# Patient Record
Sex: Female | Born: 1995
Health system: Southern US, Community
[De-identification: ages and names within clinical notes are randomized; demographics above are authoritative.]

## PROBLEM LIST (undated history)

## (undated) DIAGNOSIS — G43909 Migraine, unspecified, not intractable, without status migrainosus: Secondary | ICD-10-CM

## (undated) DIAGNOSIS — K219 Gastro-esophageal reflux disease without esophagitis: Secondary | ICD-10-CM

## (undated) DIAGNOSIS — E282 Polycystic ovarian syndrome: Secondary | ICD-10-CM

## (undated) DIAGNOSIS — J45909 Unspecified asthma, uncomplicated: Secondary | ICD-10-CM

## (undated) HISTORY — DX: Gastro-esophageal reflux disease without esophagitis: K21.9

## (undated) HISTORY — DX: Migraine, unspecified, not intractable, without status migrainosus: G43.909

## (undated) HISTORY — DX: Polycystic ovarian syndrome: E28.2

## (undated) HISTORY — PX: NO PAST SURGERIES: SHX2092

---
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
## 2015-10-05 ENCOUNTER — Other Ambulatory Visit: Payer: Self-pay | Admitting: Physician Assistant

## 2015-10-05 DIAGNOSIS — R102 Pelvic and perineal pain: Secondary | ICD-10-CM

## 2015-10-05 DIAGNOSIS — R1013 Epigastric pain: Secondary | ICD-10-CM

## 2015-10-09 ENCOUNTER — Ambulatory Visit
Admission: RE | Admit: 2015-10-09 | Discharge: 2015-10-09 | Disposition: A | Discharge status: Home / Self Care | Payer: 59 | Source: Ambulatory Visit | Attending: Physician Assistant | Admitting: Physician Assistant

## 2015-10-09 DIAGNOSIS — R102 Pelvic and perineal pain: Secondary | ICD-10-CM

## 2015-10-09 DIAGNOSIS — R1013 Epigastric pain: Secondary | ICD-10-CM

## 2016-01-18 ENCOUNTER — Emergency Department (HOSPITAL_COMMUNITY): Payer: 59

## 2016-01-18 ENCOUNTER — Emergency Department (HOSPITAL_COMMUNITY)
Admission: EM | Admit: 2016-01-18 | Discharge: 2016-01-18 | Disposition: A | Discharge status: Home / Self Care | Payer: 59 | Attending: Emergency Medicine | Admitting: Emergency Medicine

## 2016-01-18 ENCOUNTER — Encounter (HOSPITAL_COMMUNITY): Payer: Self-pay | Admitting: *Deleted

## 2016-01-18 DIAGNOSIS — R000 Tachycardia, unspecified: Secondary | ICD-10-CM | POA: Diagnosis not present

## 2016-01-18 DIAGNOSIS — Z79899 Other long term (current) drug therapy: Secondary | ICD-10-CM | POA: Diagnosis not present

## 2016-01-18 DIAGNOSIS — J45901 Unspecified asthma with (acute) exacerbation: Secondary | ICD-10-CM | POA: Diagnosis not present

## 2016-01-18 DIAGNOSIS — Z793 Long term (current) use of hormonal contraceptives: Secondary | ICD-10-CM | POA: Insufficient documentation

## 2016-01-18 DIAGNOSIS — J45909 Unspecified asthma, uncomplicated: Secondary | ICD-10-CM | POA: Diagnosis present

## 2016-01-18 DIAGNOSIS — Z7951 Long term (current) use of inhaled steroids: Secondary | ICD-10-CM | POA: Insufficient documentation

## 2016-01-18 HISTORY — DX: Unspecified asthma, uncomplicated: J45.909

## 2016-01-18 LAB — I-STAT VENOUS BLOOD GAS, ED
ACID-BASE DEFICIT: 3 mmol/L — AB (ref 0.0–2.0)
BICARBONATE: 19.3 meq/L — AB (ref 20.0–24.0)
O2 SAT: 72 %
PH VEN: 7.471 — AB (ref 7.250–7.300)
TCO2: 20 mmol/L (ref 0–100)
pCO2, Ven: 26.5 mmHg — ABNORMAL LOW (ref 45.0–50.0)
pO2, Ven: 34 mmHg (ref 30.0–45.0)

## 2016-01-18 LAB — CBC WITH DIFFERENTIAL/PLATELET
Basophils Absolute: 0 10*3/uL (ref 0.0–0.1)
Basophils Relative: 0 %
EOS ABS: 0.1 10*3/uL (ref 0.0–0.7)
EOS PCT: 1 %
HCT: 38.3 % (ref 36.0–46.0)
HEMOGLOBIN: 13.1 g/dL (ref 12.0–15.0)
LYMPHS ABS: 3.6 10*3/uL (ref 0.7–4.0)
Lymphocytes Relative: 38 %
MCH: 29.2 pg (ref 26.0–34.0)
MCHC: 34.2 g/dL (ref 30.0–36.0)
MCV: 85.5 fL (ref 78.0–100.0)
MONOS PCT: 5 %
Monocytes Absolute: 0.4 10*3/uL (ref 0.1–1.0)
Neutro Abs: 5.3 10*3/uL (ref 1.7–7.7)
Neutrophils Relative %: 56 %
PLATELETS: 263 10*3/uL (ref 150–400)
RBC: 4.48 MIL/uL (ref 3.87–5.11)
RDW: 12.8 % (ref 11.5–15.5)
WBC: 9.3 10*3/uL (ref 4.0–10.5)

## 2016-01-18 LAB — D-DIMER, QUANTITATIVE: D-Dimer, Quant: <0.27 ug/mL-FEU (ref 0.00–0.50)

## 2016-01-18 LAB — BASIC METABOLIC PANEL
Anion gap: 12 (ref 5–15)
BUN: 11 mg/dL (ref 6–20)
CHLORIDE: 109 mmol/L (ref 101–111)
CO2: 20 mmol/L — ABNORMAL LOW (ref 22–32)
CREATININE: 0.85 mg/dL (ref 0.44–1.00)
Calcium: 9.9 mg/dL (ref 8.9–10.3)
GFR calc Af Amer: >60 mL/min (ref >60)
GFR calc non Af Amer: >60 mL/min (ref >60)
Glucose, Bld: 141 mg/dL — ABNORMAL HIGH (ref 65–99)
Potassium: 3.4 mmol/L — ABNORMAL LOW (ref 3.5–5.1)
SODIUM: 141 mmol/L (ref 135–145)

## 2016-01-18 MED ORDER — PREDNISONE 20 MG PO TABS: 60.0000 mg | ORAL_TABLET | Freq: Every day | ORAL | Status: DC

## 2016-01-18 MED ORDER — MAGNESIUM SULFATE 2 GM/50ML IV SOLN
2.0000 g | Freq: Once | INTRAVENOUS | Status: AC
  Administered 2016-01-18: 2 g via INTRAVENOUS
  Filled 2016-01-18: qty 50

## 2016-01-18 MED ORDER — IPRATROPIUM BROMIDE 0.02 % IN SOLN
0.5000 mg | Freq: Once | RESPIRATORY_TRACT | Status: AC
  Administered 2016-01-18: 0.5 mg via RESPIRATORY_TRACT

## 2016-01-18 MED ORDER — METHYLPREDNISOLONE SODIUM SUCC 125 MG IJ SOLR
125.0000 mg | Freq: Once | INTRAMUSCULAR | Status: AC
  Administered 2016-01-18: 125 mg via INTRAVENOUS
  Filled 2016-01-18: qty 2

## 2016-01-18 MED ORDER — SODIUM CHLORIDE 0.9 % IV BOLUS (SEPSIS)
1000.0000 mL | Freq: Once | INTRAVENOUS | Status: AC
  Administered 2016-01-18: 1000 mL via INTRAVENOUS

## 2016-01-18 MED ORDER — ALBUTEROL (5 MG/ML) CONTINUOUS INHALATION SOLN
2.5000 mg/h | INHALATION_SOLUTION | RESPIRATORY_TRACT | Status: DC
  Administered 2016-01-18: 2.5 mg/h via RESPIRATORY_TRACT
  Filled 2016-01-18 (×2): qty 20

## 2016-01-18 MED ORDER — IPRATROPIUM BROMIDE 0.02 % IN SOLN
1.0000 mg | Freq: Once | RESPIRATORY_TRACT | Status: AC
  Administered 2016-01-18: 1 mg via RESPIRATORY_TRACT
  Filled 2016-01-18: qty 5

## 2016-01-18 MED ORDER — ALBUTEROL SULFATE (2.5 MG/3ML) 0.083% IN NEBU
5.0000 mg | INHALATION_SOLUTION | Freq: Once | RESPIRATORY_TRACT | Status: AC
  Administered 2016-01-18: 5 mg via RESPIRATORY_TRACT

## 2016-01-18 NOTE — Discharge Instructions (Signed)
Asthma Attack Prevention Ms. Pickard, continue to use your albuterol inhaler every 6 hours for the next 2 days. Then only use it as needed. Take prednisone for 4 more days to present your asthma from coming back. See a primary care physician within 3 days for close follow-up. If any symptoms worsen come back to emergency department immediately. Thank you. While you may not be able to control the fact that you have asthma, you can take actions to prevent asthma attacks. The best way to prevent asthma attacks is to maintain good control of your asthma. You can achieve this by:  Taking your medicines as directed.  Avoiding things that can irritate your airways or make your asthma symptoms worse (asthma triggers).  Keeping track of how well your asthma is controlled and of any changes in your symptoms.  Responding quickly to worsening asthma symptoms (asthma attack).  Seeking emergency care when it is needed. WHAT ARE SOME WAYS TO PREVENT AN ASTHMA ATTACK? Have a Plan Work with your health care provider to create a written plan for managing and treating your asthma attacks (asthma action plan). This plan includes:  A list of your asthma triggers and how you can avoid them.  Information on when medicines should be taken and when their dosages should be changed.  The use of a device that measures how well your lungs are working (peak flow meter). Monitor Your Asthma Use your peak flow meter and record your results in a journal every day. A drop in your peak flow numbers on one or more days may indicate the start of an asthma attack. This can happen even before you start to feel symptoms. You can prevent an asthma attack from getting worse by following the steps in your asthma action plan. Avoid Asthma Triggers Work with your asthma health care provider to find out what your asthma triggers are. This can be done by:  Allergy testing.  Keeping a journal that notes when asthma attacks occur and  the factors that may have contributed to them.  Determining if there are other medical conditions that are making your asthma worse. Once you have determined your asthma triggers, take steps to avoid them. This may include avoiding excessive or prolonged exposure to:  Dust. Have someone dust and vacuum your home for you once or twice a week. Using a high-efficiency particulate arrestance (HEPA) vacuum is best.  Smoke. This includes campfire smoke, forest fire smoke, and secondhand smoke from tobacco products.  Pet dander. Avoid contact with animals that you know you are allergic to.  Allergens from trees, grasses or pollens. Avoid spending a lot of time outdoors when pollen counts are high, and on very windy days.  Very cold, dry, or humid air.  Mold.  Foods that contain high amounts of sulfites.  Strong odors.  Outdoor air pollutants, such as Museum/gallery exhibitions officer.  Indoor air pollutants, such as aerosol sprays and fumes from household cleaners.  Household pests, including dust mites and cockroaches, and pest droppings.  Certain medicines, including NSAIDs. Always talk to your health care provider before stopping or starting any new medicines. Medicines Take over-the-counter and prescription medicines only as told by your health care provider. Many asthma attacks can be prevented by carefully following your medicine schedule. Taking your medicines correctly is especially important when you cannot avoid certain asthma triggers. Act Quickly If an asthma attack does happen, acting quickly can decrease how severe it is and how long it lasts. Take these steps:  Pay attention to your symptoms. If you are coughing, wheezing, or having difficulty breathing, do not wait to see if your symptoms go away on their own. Follow your asthma action plan.  If you have followed your asthma action plan and your symptoms are not improving, call your health care provider or seek immediate medical care at the  nearest hospital. It is important to note how often you need to use your fast-acting rescue inhaler. If you are using your rescue inhaler more often, it may mean that your asthma is not under control. Adjusting your asthma treatment plan may help you to prevent future asthma attacks and help you to gain better control of your condition. HOW CAN I PREVENT AN ASTHMA ATTACK WHEN I EXERCISE? Follow advice from your health care provider about whether you should use your fast-acting inhaler before exercising. Many people with asthma experience exercise-induced bronchoconstriction (EIB). This condition often worsens during vigorous exercise in cold, humid, or dry environments. Usually, people with EIB can stay very active by pre-treating with a fast-acting inhaler before exercising.   This information is not intended to replace advice given to you by your health care provider. Make sure you discuss any questions you have with your health care provider.   Document Released: 11/19/2009 Document Revised: 08/22/2015 Document Reviewed: 05/03/2015 Elsevier Interactive Patient Education [employer]

## 2016-01-18 NOTE — ED Provider Notes (Signed)
CSN: [id number]     Arrival date & time 01/18/16  0107 History  By signing my name below, I, Tammy French, attest that this documentation has been prepared under the direction and in the presence of [name], MD. Electronically Signed: [name], ED Scribe. 01/18/2016. 1:55 AM.    Chief Complaint  Patient presents with  . Asthma   The history is provided by the patient and a parent. No language interpreter was used.   HPI Comments: Tammy French is a 20 y.o. female, with a h/o asthma, who presents to the Emergency Department complaining of sudden onset, constant centralized chest pain onset 3 hours ago with progression of sudden onset, worsening SOB following onset of CP while driving home from work. The pt has a h/o asthma but reports her current chest pain is abnormal of her asthma exacerbations. She notes a mild cough today but no other associated symptoms. The pt attempted a breathing treatment prior to arrival but was unable to finish the treatment due to worsening of SOB. She has also used her inhaler 4 times since onset of SOB without relief. The pt received her first Depo injection 4 days ago.  The pt denies any recent illness. Mom denies the pt to have been on recent long trips, to have received recent surgery, or pain or edema to BLE.    Past Medical History  Diagnosis Date  . Asthma    History reviewed. No pertinent past surgical history. Family History  Problem Relation Age of Onset  . Diabetes Maternal Grandmother    Social History  Substance Use Topics  . Smoking status: Never Smoker   . Smokeless tobacco: Never Used  . Alcohol Use: 0.5 oz/week    1 Standard drinks or equivalent per week   OB History    No data available     Review of Systems  Respiratory: Positive for shortness of breath.   Cardiovascular: Positive for chest pain. Negative for leg swelling.  A complete 10 system review of systems was obtained and is otherwise negative except at noted  in the HPI and PMH.  Allergies  Review of patient's allergies indicates no known allergies.  Home Medications   Prior to Admission medications   Medication Sig Start Date End Date Taking? Authorizing Provider  beclomethasone (QVAR) 40 MCG/ACT inhaler Inhale 2 puffs into the lungs 2 (two) times daily as needed (SHORTNESS  OF BREATH).   Yes Historical Provider, MD  medroxyPROGESTERone (DEPO-PROVERA) 150 MG/ML injection Inject 150 mg into the muscle every 3 (three) months.   Yes Historical Provider, MD  polyethylene glycol (MIRALAX / GLYCOLAX) packet Take 17 g by mouth daily as needed for moderate constipation.   Yes Historical Provider, MD   BP 134/67 mmHg  Pulse 103  Resp 34  Ht  (1.676 m)  Wt 148 lb (67.132 kg)  BMI 23.90 kg/m2  SpO2 100%  LMP 12/18/2015 Physical Exam  Constitutional: She is oriented to person, place, and time. She appears well-developed and well-nourished. No distress.  HENT:  Head: Normocephalic and atraumatic.  Nose: Nose normal.  Mouth/Throat: Oropharynx is clear and moist. No oropharyngeal exudate.  Eyes: Conjunctivae and EOM are normal. Pupils are equal, round, and reactive to light. No scleral icterus.  Neck: Normal range of motion. Neck supple. No JVD present. No tracheal deviation present. No thyromegaly present.  Cardiovascular: Regular rhythm and normal heart sounds.  Exam reveals no gallop and no friction rub.   No murmur heard. Tachycardic.  Pulmonary/Chest: Breath sounds normal. She is in respiratory distress. She has no wheezes. She exhibits no tenderness.  Tachypnea, increased use of accessory muscles, increased work of breathing, respiratory distress, lungs clear.  Abdominal: Soft. Bowel sounds are normal. She exhibits no distension and no mass. There is no tenderness. There is no rebound and no guarding.  Musculoskeletal: Normal range of motion. She exhibits no edema or tenderness.  Lymphadenopathy:    She has no cervical adenopathy.   Neurological: She is alert and oriented to person, place, and time. No cranial nerve deficit. She exhibits normal muscle tone.  Skin: Skin is warm and dry. No rash noted. No erythema. No pallor.  Nursing note and vitals reviewed.   ED Course  Procedures  DIAGNOSTIC STUDIES: Oxygen Saturation is 100% on RA, normal by my interpretation.    COORDINATION OF CARE: 1:27 AM Discussed treatment plan with pt. Pt acknowledges and agrees to plan.   Labs Review Labs Reviewed  BASIC METABOLIC PANEL - Abnormal; Notable for the following:    Potassium 3.4 (*)    CO2 20 (*)    Glucose, Bld 141 (*)    All other components within normal limits  I-STAT VENOUS BLOOD GAS, ED - Abnormal; Notable for the following:    pH, Ven 7.471 (*)    pCO2, Ven 26.5 (*)    Bicarbonate 19.3 (*)    Acid-base deficit 3.0 (*)    All other components within normal limits  CBC WITH DIFFERENTIAL/PLATELET  D-DIMER, QUANTITATIVE (NOT AT [facility])    Imaging Review Dg Chest 2 View  01/18/2016  CLINICAL DATA:  Left-sided chest pain for 1 day EXAM: CHEST  2 VIEW COMPARISON:  None. FINDINGS: Normal heart size and mediastinal contours. No acute infiltrate or edema. No effusion or pneumothorax. No acute osseous findings. IMPRESSION: Negative chest. Electronically Signed   By: [name] M.D.   On: 01/18/2016 03:28   I have personally reviewed and evaluated these images and lab results as part of my medical decision-making.   EKG Interpretation   Date/Time:  Friday January 18 2016 01:37:48 EST Ventricular Rate:  103 PR Interval:  151 QRS Duration: 82 QT Interval:  331 QTC Calculation: 433 R Axis:   51 Text Interpretation:  Sinus tachycardia No old tracing to compare  Confirmed by Erroll Luna [phone]) on 01/18/2016 2:04:22 AM      MDM   Final diagnoses:  None   Patient presents emergency department for shortness of breath. She states is consistent with her asthma however she is having chest tightness as  well that is worse with inspiration. She states this is not normal for her. She has no other risk factors for pulmonary embolism. Will obtain d-dimer. She was given albuterol, ipratropium, sodium Medrol, magnesium for treatment. Patient also given 1 L of IV fluids.  Her initial presentation revealed no wheezing, this is unusual for asthma. Mom also states is unusual for her not to hear audible wheezing at home.  Dimer is negative. Chest x-ray does not show pneumonia. Symptomatically patient states she feels much better. Her tachypnea and increased work of breathing has resolved. We'll discharge home with 4 more days of prednisone. Primary care follow-up is advised. Patient is tachycardic likely due to albuterol treatments. She appears well-developed and acute distress, vital signs remain within her normal limits and she is French for discharge.   I personally performed the services described in this documentation, which was scribed in my presence. The recorded information has been  reviewed and is accurate.      [name], MD 01/18/16 [id number]

## 2016-01-18 NOTE — ED Notes (Signed)
The ptis c/o having shortness of breath today. Her inhaler and her hhn machine has not been helping  She arrived with much resp difficulty. Unable to speak at triage lmpjan

## 2016-04-25 ENCOUNTER — Emergency Department (HOSPITAL_COMMUNITY): Payer: 59

## 2016-04-25 ENCOUNTER — Encounter (HOSPITAL_COMMUNITY): Payer: Self-pay

## 2016-04-25 ENCOUNTER — Emergency Department (HOSPITAL_COMMUNITY)
Admission: EM | Admit: 2016-04-25 | Discharge: 2016-04-25 | Disposition: A | Discharge status: Home / Self Care | Payer: 59 | Attending: Emergency Medicine | Admitting: Emergency Medicine

## 2016-04-25 DIAGNOSIS — S3992XA Unspecified injury of lower back, initial encounter: Secondary | ICD-10-CM | POA: Insufficient documentation

## 2016-04-25 DIAGNOSIS — Y92410 Unspecified street and highway as the place of occurrence of the external cause: Secondary | ICD-10-CM | POA: Insufficient documentation

## 2016-04-25 DIAGNOSIS — S29002A Unspecified injury of muscle and tendon of back wall of thorax, initial encounter: Secondary | ICD-10-CM | POA: Diagnosis not present

## 2016-04-25 DIAGNOSIS — Z3202 Encounter for pregnancy test, result negative: Secondary | ICD-10-CM | POA: Diagnosis not present

## 2016-04-25 DIAGNOSIS — Y998 Other external cause status: Secondary | ICD-10-CM | POA: Diagnosis not present

## 2016-04-25 DIAGNOSIS — Y9389 Activity, other specified: Secondary | ICD-10-CM | POA: Diagnosis not present

## 2016-04-25 DIAGNOSIS — M549 Dorsalgia, unspecified: Secondary | ICD-10-CM

## 2016-04-25 DIAGNOSIS — S4992XA Unspecified injury of left shoulder and upper arm, initial encounter: Secondary | ICD-10-CM | POA: Insufficient documentation

## 2016-04-25 DIAGNOSIS — J45909 Unspecified asthma, uncomplicated: Secondary | ICD-10-CM | POA: Diagnosis not present

## 2016-04-25 DIAGNOSIS — Z7952 Long term (current) use of systemic steroids: Secondary | ICD-10-CM | POA: Insufficient documentation

## 2016-04-25 DIAGNOSIS — M545 Low back pain, unspecified: Secondary | ICD-10-CM

## 2016-04-25 LAB — POC URINE PREG, ED: Preg Test, Ur: NEGATIVE

## 2016-04-25 MED ORDER — NAPROXEN 500 MG PO TABS
500.0000 mg | ORAL_TABLET | Freq: Two times a day (BID) | ORAL | Status: DC
Start: 2016-04-25 — End: 2017-08-21

## 2016-04-25 MED ORDER — METHOCARBAMOL 500 MG PO TABS: 500.0000 mg | ORAL_TABLET | Freq: Two times a day (BID) | ORAL | Status: DC
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
## 2016-09-14 ENCOUNTER — Emergency Department (HOSPITAL_COMMUNITY)
Admission: EM | Admit: 2016-09-14 | Discharge: 2016-09-14 | Disposition: A | Discharge status: Home / Self Care | Payer: 59 | Attending: Emergency Medicine | Admitting: Emergency Medicine

## 2016-09-14 ENCOUNTER — Encounter (HOSPITAL_COMMUNITY): Payer: Self-pay | Admitting: Oncology

## 2016-09-14 DIAGNOSIS — J45909 Unspecified asthma, uncomplicated: Secondary | ICD-10-CM | POA: Insufficient documentation

## 2016-09-14 DIAGNOSIS — Z79899 Other long term (current) drug therapy: Secondary | ICD-10-CM | POA: Insufficient documentation

## 2016-09-14 DIAGNOSIS — Z791 Long term (current) use of non-steroidal anti-inflammatories (NSAID): Secondary | ICD-10-CM | POA: Diagnosis not present

## 2016-09-14 DIAGNOSIS — M545 Low back pain: Secondary | ICD-10-CM | POA: Diagnosis present

## 2016-09-14 DIAGNOSIS — L0501 Pilonidal cyst with abscess: Secondary | ICD-10-CM | POA: Diagnosis not present

## 2016-09-14 MED ORDER — TRAMADOL HCL 50 MG PO TABS: 50.0000 mg | ORAL_TABLET | Freq: Four times a day (QID) | ORAL | 0 refills | Status: DC | PRN

## 2016-09-14 MED ORDER — DOCUSATE SODIUM 100 MG PO CAPS: 100.0000 mg | ORAL_CAPSULE | Freq: Two times a day (BID) | ORAL | 0 refills | Status: DC

## 2016-09-14 MED ORDER — OXYCODONE-ACETAMINOPHEN 5-325 MG PO TABS
1.0000 | ORAL_TABLET | Freq: Once | ORAL | Status: AC
  Administered 2016-09-14: 1 via ORAL
  Filled 2016-09-14: qty 1

## 2016-09-14 MED ORDER — SULFAMETHOXAZOLE-TRIMETHOPRIM 800-160 MG PO TABS: 1.0000 | ORAL_TABLET | Freq: Two times a day (BID) | ORAL | 0 refills | Status: AC

## 2016-09-14 NOTE — ED Triage Notes (Signed)
Pt c/o pain in gluteal cleft x5 days.  Area is not edematous or red.  Denies fever.  Rates pain 9/10.

## 2016-09-14 NOTE — ED Provider Notes (Signed)
[facility] Provider Note   CSN: [id number] Arrival date & time: 09/14/16  0022  By signing my name below, I, Christy Sartorius, attest that this documentation has been prepared under the direction and in the presence of Gilda Crease, MD . Electronically Signed: Christy Sartorius, Scribe. 09/14/2016. 1:08 AM.  History   Chief Complaint Chief Complaint  Patient presents with  . Abscess   The history is provided by the patient and medical records. No language interpreter was used.     HPI Comments:  Tammy French is a 20 y.o. female who presents to the Emergency Department complaining of pain in her medial buttocks along her coccyx.  Her pain is worse when sitting or pressing on the site.  She states she's never had this problem before.  No alleviating factors noted.  Pt denies known bumps or swelling at the site and recent injury to her coccyx.    Past Medical History:  Diagnosis Date  . Asthma     There are no active problems to display for this patient.   History reviewed. No pertinent surgical history.  OB History    No data available       Home Medications    Prior to Admission medications   Medication Sig Start Date End Date Taking? Authorizing Provider  beclomethasone (QVAR) 40 MCG/ACT inhaler Inhale 2 puffs into the lungs 2 (two) times daily as needed (SHORTNESS  OF BREATH).    Historical Provider, MD  docusate sodium (COLACE) 100 MG capsule Take 1 capsule (100 mg total) by mouth every 12 (twelve) hours. 09/14/16   Gilda Crease, MD  medroxyPROGESTERone (DEPO-PROVERA) 150 MG/ML injection Inject 150 mg into the muscle every 3 (three) months.    Historical Provider, MD  methocarbamol (ROBAXIN) 500 MG tablet Take 1 tablet (500 mg total) by mouth 2 (two) times daily. 04/25/16   Heather Laisure, PA-C  naproxen (NAPROSYN) 500 MG tablet Take 1 tablet (500 mg total) by mouth 2 (two) times daily. 04/25/16   Heather Laisure, PA-C  polyethylene glycol  (MIRALAX / GLYCOLAX) packet Take 17 g by mouth daily as needed for moderate constipation.    Historical Provider, MD  predniSONE (DELTASONE) 20 MG tablet Take 3 tablets (60 mg total) by mouth daily. 01/18/16   [name], MD  sulfamethoxazole-trimethoprim (BACTRIM DS,SEPTRA DS) 800-160 MG tablet Take 1 tablet by mouth 2 (two) times daily. 09/14/16 09/24/16  Gilda Crease, MD  traMADol (ULTRAM) 50 MG tablet Take 1 tablet (50 mg total) by mouth every 6 (six) hours as needed. 09/14/16   Gilda Crease, MD    Family History Family History  Problem Relation Age of Onset  . Diabetes Maternal Grandmother     Social History Social History  Substance Use Topics  . Smoking status: Never Smoker  . Smokeless tobacco: Never Used  . Alcohol use 0.5 oz/week    1 Standard drinks or equivalent per week     Allergies   Review of patient's allergies indicates no known allergies.   Review of Systems Review of Systems  Constitutional: Negative for fever.  Musculoskeletal: Positive for myalgias.  Skin:       Pain in superior gluteal cleft.    All other systems reviewed and are negative.  Physical Exam Updated Vital Signs BP 120/75 (BP Location: Left Arm)   Pulse 90   Temp 98.4 F (36.9 C) (Oral)   Resp 13   Ht 5\' 6"  (1.676 m)   Wt 160 lb (  72.6 kg)   SpO2 100%   BMI 25.82 kg/m   Physical Exam  Constitutional: She is oriented to person, place, and time. She appears well-developed and well-nourished. No distress.  HENT:  Head: Normocephalic and atraumatic.  Right Ear: Hearing normal.  Left Ear: Hearing normal.  Nose: Nose normal.  Mouth/Throat: Oropharynx is clear and moist and mucous membranes are normal.  Eyes: Conjunctivae and EOM are normal. Pupils are equal, round, and reactive to light.  Neck: Normal range of motion. Neck supple.  Cardiovascular: Regular rhythm, S1 normal and S2 normal.  Exam reveals no gallop and no friction rub.   No murmur  heard. Pulmonary/Chest: Effort normal and breath sounds normal. No respiratory distress. She exhibits no tenderness.  Abdominal: Soft. Normal appearance and bowel sounds are normal. There is no hepatosplenomegaly. There is no tenderness. There is no rebound, no guarding, no tenderness at McBurney's point and negative Murphy's sign. No hernia.  Musculoskeletal: Normal range of motion.  Neurological: She is alert and oriented to person, place, and time. She has normal strength. No cranial nerve deficit or sensory deficit. Coordination normal. GCS eye subscore is 4. GCS verbal subscore is 5. GCS motor subscore is 6.  Skin: Skin is warm, dry and intact. No rash noted. No cyanosis.  Tenderness to the superior protion of the gluteal cleft.  Without erythema, warmth, induration or fluctuance.    Psychiatric: She has a normal mood and affect. Her speech is normal and behavior is normal. Thought content normal.  Nursing note and vitals reviewed.    ED Treatments / Results   DIAGNOSTIC STUDIES:  Oxygen Saturation is 100% on RA, NML by my interpretation.    COORDINATION OF CARE:  1:08 AM Discussed treatment plan with pt at bedside and pt agreed to plan.  Labs (all labs ordered are listed, but only abnormal results are displayed) Labs Reviewed - No data to display  EKG  EKG Interpretation None       Radiology No results found.  Procedures Procedures (including critical care time)  Medications Ordered in ED Medications  oxyCODONE-acetaminophen (PERCOCET/ROXICET) 5-325 MG per tablet 1 tablet (not administered)     Initial Impression / Assessment and Plan / ED Course  I have reviewed the triage vital signs and the nursing notes.  Pertinent labs & imaging results that were available during my care of the patient were reviewed by me and considered in my medical decision making (see chart for details).  Clinical Course     Patient with skin abscess.  Supportive care and return  precautions discussed.  Pt sent home with Analgesia, antibiotics. The patient appears reasonably screened and/or stabilized for discharge and I doubt any other emergent medical condition requiring further screening, evaluation, or treatment in the ED prior to discharge.  Will refer to general surgery. Patient was counseled to return to the ER if the area continues to worsen for emergency department incision and drainage   Final Clinical Impressions(s) / ED Diagnoses   Final diagnoses:  Pilonidal abscess    New Prescriptions New Prescriptions   DOCUSATE SODIUM (COLACE) 100 MG CAPSULE    Take 1 capsule (100 mg total) by mouth every 12 (twelve) hours.   SULFAMETHOXAZOLE-TRIMETHOPRIM (BACTRIM DS,SEPTRA DS) 800-160 MG TABLET    Take 1 tablet by mouth 2 (two) times daily.   TRAMADOL (ULTRAM) 50 MG TABLET    Take 1 tablet (50 mg total) by mouth every 6 (six) hours as needed.   I personally performed the  services described in this documentation, which was scribed in my presence. The recorded information has been reviewed and is accurate.    Gilda Creasehristopher J Shepherd Finnan, MD 09/14/16 [phone]

## 2016-09-17 ENCOUNTER — Ambulatory Visit: Payer: Self-pay | Admitting: General Surgery

## 2016-09-22 ENCOUNTER — Ambulatory Visit (HOSPITAL_COMMUNITY)
Admission: EM | Admit: 2016-09-22 | Discharge: 2016-09-22 | Disposition: A | Discharge status: Home / Self Care | Payer: 59 | Attending: Internal Medicine | Admitting: Internal Medicine

## 2016-09-22 ENCOUNTER — Encounter (HOSPITAL_COMMUNITY): Payer: Self-pay | Admitting: Emergency Medicine

## 2016-09-22 DIAGNOSIS — Z872 Personal history of diseases of the skin and subcutaneous tissue: Secondary | ICD-10-CM

## 2016-09-22 MED ORDER — LIDOCAINE HCL (PF) 1 % IJ SOLN
INTRAMUSCULAR | Status: AC
  Filled 2016-09-22: qty 30

## 2016-09-22 NOTE — ED Notes (Signed)
Dressing  Applied   To     Buttock

## 2016-09-22 NOTE — ED Provider Notes (Signed)
[facility]    CSN: [id number] Arrival date & time: 09/22/16  1028     History   Chief Complaint Chief Complaint  Patient presents with  . Recurrent Skin Infections    HPI Tammy French is a 20 y.o. female. Seen in ED 10/1 with buttocks abscess and referred to surgery center.  Finished prescribed antibiotic with marked improvement in pain/swelling, presented to surgery center today for scheduled excision but had been rescheduled.  Has off school today.  No fever, no malaise.    HPI  Past Medical History:  Diagnosis Date  . Asthma    History reviewed. No pertinent surgical history.   Home Medications    Prior to Admission medications   Medication Sig Start Date End Date Taking? Authorizing Provider  beclomethasone (QVAR) 40 MCG/ACT inhaler Inhale 2 puffs into the lungs 2 (two) times daily as needed (SHORTNESS  OF BREATH).    Historical Provider, MD  docusate sodium (COLACE) 100 MG capsule Take 1 capsule (100 mg total) by mouth every 12 (twelve) hours. 09/14/16   Gilda Crease, MD  medroxyPROGESTERone (DEPO-PROVERA) 150 MG/ML injection Inject 150 mg into the muscle every 3 (three) months.    Historical Provider, MD  methocarbamol (ROBAXIN) 500 MG tablet Take 1 tablet (500 mg total) by mouth 2 (two) times daily. 04/25/16   Heather Laisure, PA-C  naproxen (NAPROSYN) 500 MG tablet Take 1 tablet (500 mg total) by mouth 2 (two) times daily. 04/25/16   Heather Laisure, PA-C  polyethylene glycol (MIRALAX / GLYCOLAX) packet Take 17 g by mouth daily as needed for moderate constipation.    Historical Provider, MD  predniSONE (DELTASONE) 20 MG tablet Take 3 tablets (60 mg total) by mouth daily. 01/18/16   [name], MD  sulfamethoxazole-trimethoprim (BACTRIM DS,SEPTRA DS) 800-160 MG tablet Take 1 tablet by mouth 2 (two) times daily. 09/14/16 09/24/16  Gilda Crease, MD  traMADol (ULTRAM) 50 MG tablet Take 1 tablet (50 mg total) by mouth every 6 (six) hours as  needed. 09/14/16   Gilda Crease, MD    Family History Family History  Problem Relation Age of Onset  . Diabetes Maternal Grandmother     Social History Social History  Substance Use Topics  . Smoking status: Never Smoker  . Smokeless tobacco: Never Used  . Alcohol use No     Allergies   Review of patient's allergies indicates no known allergies.   Review of Systems Review of Systems  All other systems reviewed and are negative.    Physical Exam Triage Vital Signs ED Triage Vitals  Enc Vitals Group     BP 09/22/16 1052 128/63     Pulse Rate 09/22/16 1052 87     Resp 09/22/16 1052 18     Temp 09/22/16 1052 98.9 F (37.2 C)     Temp Source 09/22/16 1052 Oral     SpO2 09/22/16 1052 100 %     Weight --      Height --      Pain Score 09/22/16 1054 5   Updated Vital Signs BP 128/63 (BP Location: Left Arm)   Pulse 87   Temp 98.9 F (37.2 C) (Oral)   Resp 18   SpO2 100%  Physical Exam  Constitutional: She is oriented to person, place, and time. No distress.  Alert, nicely groomed  HENT:  Head: Atraumatic.  Eyes:  Conjugate gaze, no eye redness/drainage  Neck: Neck supple.  Cardiovascular: Normal rate.   Pulmonary/Chest: No  respiratory distress.  Abdominal: She exhibits no distension.  Musculoskeletal: Normal range of motion.  No leg swelling  Neurological: She is alert and oriented to person, place, and time.  Skin: Skin is warm and dry.  No cyanosis Indurated possibly fluctuant area right upper medial buttock, minimal tenderness, not pointing.    Nursing note and vitals reviewed.    UC Treatments / Results   Procedures Procedures (including critical care time) At request of patient to explore for possible purulent material, skin prepped with hibiclens and infiltrated with lidocaine.  2cm incision made, without significant return of pus.  Wound gently explored for loculation.  A single 3-0 interruped suture placed to re-approximate.   Antibiotic ointment and dry dressing applied.  Suture out in a week.   Final Clinical Impressions(s) / UC Diagnoses   Final diagnoses:  History of abscess of skin and subcutaneous tissue   Residual abscess induration with question of fluctuance incised today, no significant pus returned.  Suture placed; remove suture in about 7 days.  Wash wound gently with soap/water daily, apply antibiotic ointment and bandaid.  Recheck for increasing pain/swelling/redness/drainage or new fever >100.5.       Eustace MooreLaura W Luwanna Brossman, MD 09/22/16 2155

## 2016-09-22 NOTE — ED Triage Notes (Signed)
[facility] with a Boil on her Right Buttocks, she states that she has had it for over a week. She has been taking antibiotics and it does not seem to be getting better.

## 2016-09-22 NOTE — Discharge Instructions (Addendum)
Residual abscess induration with question of fluctuance incised today, no significant pus returned.  Suture placed; remove suture in about 7 days.  Wash wound gently with soap/water daily, apply antibiotic ointment and bandaid.  Recheck for increasing pain/swelling/redness/drainage or new fever >100.5.

## 2016-09-29 ENCOUNTER — Ambulatory Visit (HOSPITAL_COMMUNITY)
Admission: EM | Admit: 2016-09-29 | Discharge: 2016-09-29 | Disposition: A | Discharge status: Home / Self Care | Payer: 59 | Attending: Family Medicine | Admitting: Family Medicine

## 2016-09-29 ENCOUNTER — Encounter (HOSPITAL_COMMUNITY): Payer: Self-pay | Admitting: Emergency Medicine

## 2016-09-29 DIAGNOSIS — Z4802 Encounter for removal of sutures: Secondary | ICD-10-CM

## 2016-09-29 NOTE — ED Provider Notes (Signed)
[facility]    CSN: [id number] Arrival date & time: 09/29/16  1106     History   Chief Complaint Chief Complaint  Patient presents with  . Suture / Staple Removal    HPI Tammy French is a 20 y.o. female.   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 She was seen a week ago when an abscess was I&D. She's had no pain or discharge since.      Past Medical History:  Diagnosis Date  . Asthma     There are no active problems to display for this patient.   History reviewed. No pertinent surgical history.  OB History    No data available       Home Medications    Prior to Admission medications   Medication Sig Start Date End Date Taking? Authorizing Provider  beclomethasone (QVAR) 40 MCG/ACT inhaler Inhale 2 puffs into the lungs 2 (two) times daily as needed (SHORTNESS  OF BREATH).   Yes Historical Provider, MD  docusate sodium (COLACE) 100 MG capsule Take 1 capsule (100 mg total) by mouth every 12 (twelve) hours. 09/14/16   Gilda Creasehristopher J Pollina, MD  medroxyPROGESTERone (DEPO-PROVERA) 150 MG/ML injection Inject 150 mg into the muscle every 3 (three) months.    Historical Provider, MD  methocarbamol (ROBAXIN) 500 MG tablet Take 1 tablet (500 mg total) by mouth 2 (two) times daily. 04/25/16   Heather Laisure, PA-C  naproxen (NAPROSYN) 500 MG tablet Take 1 tablet (500 mg total) by mouth 2 (two) times daily. 04/25/16   Heather Laisure, PA-C  polyethylene glycol (MIRALAX / GLYCOLAX) packet Take 17 g by mouth daily as needed for moderate constipation.    Historical Provider, MD  predniSONE (DELTASONE) 20 MG tablet Take 3 tablets (60 mg total) by mouth daily. 01/18/16   Tomasita CrumbleAdeleke Oni, MD  traMADol (ULTRAM) 50 MG tablet Take 1 tablet (50 mg total) by mouth every 6 (six) hours as needed. 09/14/16   Gilda Creasehristopher J Pollina, MD    Family History Family History  Problem Relation Age of Onset  . Diabetes Maternal Grandmother     Social  History Social History  Substance Use Topics  . Smoking status: Never Smoker  . Smokeless tobacco: Never Used  . Alcohol use No     Allergies   Review of patient's allergies indicates no known allergies.   Review of Systems Review of Systems  Constitutional: Negative.   Skin: Negative.      Physical Exam Triage Vital Signs ED Triage Vitals [09/29/16 1251]  Enc Vitals Group     BP 125/67     Pulse      Resp 12     Temp 98.7 F (37.1 C)     Temp Source Oral     SpO2 100 %     Weight      Height      Head Circumference      Peak Flow      Pain Score      Pain Loc      Pain Edu?      Excl. in GC?    No data found.   Updated Vital Signs BP 125/67 (BP Location: Left Arm)   Temp 98.7 F (37.1 C) (Oral)   Resp 12   SpO2 100%   Visual Acuity Right Eye Distance:   Left Eye Distance:   Bilateral Distance:    Right Eye Near:   Left Eye Near:  Bilateral Near:     Physical Exam  Constitutional: She appears well-developed and well-nourished.  Skin:  One suture removed. Area of infection is peeling and there is no subcutaneous induration, tenderness, or fluctuance.  Nursing note and vitals reviewed.    UC Treatments / Results  Labs (all labs ordered are listed, but only abnormal results are displayed) Labs Reviewed - No data to display  EKG  EKG Interpretation None       Radiology No results found.  Procedures Procedures (including critical care time)  Medications Ordered in UC Medications - No data to display   Initial Impression / Assessment and Plan / UC Course  I have reviewed the triage vital signs and the nursing notes.  Pertinent labs & imaging results that were available during my care of the patient were reviewed by me and considered in my medical decision making (see chart for details).  Clinical Course    One suture removed  Final Clinical Impressions(s) / UC Diagnoses   Final diagnoses:  Visit for suture removal     New Prescriptions New Prescriptions   No medications on file     Elvina Sidle, MD 09/29/16 1326

## 2016-09-29 NOTE — ED Triage Notes (Signed)
Patient presents today for a suture removal. She states that she has stitches in her Right Buttocks

## 2016-12-23 DIAGNOSIS — Z01419 Encounter for gynecological examination (general) (routine) without abnormal findings: Secondary | ICD-10-CM | POA: Diagnosis not present

## 2016-12-23 DIAGNOSIS — Z118 Encounter for screening for other infectious and parasitic diseases: Secondary | ICD-10-CM | POA: Diagnosis not present

## 2016-12-24 DIAGNOSIS — B9789 Other viral agents as the cause of diseases classified elsewhere: Secondary | ICD-10-CM | POA: Diagnosis not present

## 2016-12-24 DIAGNOSIS — R05 Cough: Secondary | ICD-10-CM | POA: Diagnosis not present

## 2016-12-24 DIAGNOSIS — J069 Acute upper respiratory infection, unspecified: Secondary | ICD-10-CM | POA: Diagnosis not present

## 2017-01-23 ENCOUNTER — Emergency Department (HOSPITAL_COMMUNITY)
Admission: EM | Admit: 2017-01-23 | Discharge: 2017-01-24 | Disposition: A | Discharge status: Home / Self Care | Payer: 59 | Attending: Emergency Medicine | Admitting: Emergency Medicine

## 2017-01-23 ENCOUNTER — Encounter (HOSPITAL_COMMUNITY): Payer: Self-pay | Admitting: *Deleted

## 2017-01-23 DIAGNOSIS — R079 Chest pain, unspecified: Secondary | ICD-10-CM | POA: Diagnosis not present

## 2017-01-23 DIAGNOSIS — R062 Wheezing: Secondary | ICD-10-CM | POA: Diagnosis not present

## 2017-01-23 DIAGNOSIS — J4521 Mild intermittent asthma with (acute) exacerbation: Secondary | ICD-10-CM | POA: Diagnosis not present

## 2017-01-23 MED ORDER — ALBUTEROL SULFATE (2.5 MG/3ML) 0.083% IN NEBU
5.0000 mg | INHALATION_SOLUTION | Freq: Once | RESPIRATORY_TRACT | Status: AC
  Administered 2017-01-23: 5 mg via RESPIRATORY_TRACT
  Filled 2017-01-23: qty 6

## 2017-01-23 MED ORDER — IPRATROPIUM BROMIDE 0.02 % IN SOLN
0.5000 mg | Freq: Once | RESPIRATORY_TRACT | Status: AC
  Administered 2017-01-23: 0.5 mg via RESPIRATORY_TRACT
  Filled 2017-01-23: qty 2.5

## 2017-01-23 NOTE — ED Triage Notes (Signed)
The pt is an asthmatic and for 2-3 days she has had more difficulty breathing with some chest pain  Cold  She ran out of albuterol and her breathing is worse since then  lmp none bc

## 2017-01-24 ENCOUNTER — Emergency Department (HOSPITAL_COMMUNITY): Payer: 59

## 2017-01-24 DIAGNOSIS — R079 Chest pain, unspecified: Secondary | ICD-10-CM | POA: Diagnosis not present

## 2017-01-24 MED ORDER — PREDNISONE 50 MG PO TABS: ORAL_TABLET | ORAL | 0 refills | Status: DC

## 2017-01-24 MED ORDER — PREDNISONE 20 MG PO TABS
60.0000 mg | ORAL_TABLET | Freq: Once | ORAL | Status: AC
  Administered 2017-01-24: 60 mg via ORAL
  Filled 2017-01-24: qty 3

## 2017-01-24 MED ORDER — IPRATROPIUM-ALBUTEROL 0.5-2.5 (3) MG/3ML IN SOLN
3.0000 mL | Freq: Once | RESPIRATORY_TRACT | Status: AC
  Administered 2017-01-24: 3 mL via RESPIRATORY_TRACT
  Filled 2017-01-24: qty 3

## 2017-01-24 MED ORDER — ALBUTEROL SULFATE HFA 108 (90 BASE) MCG/ACT IN AERS
2.0000 | INHALATION_SPRAY | RESPIRATORY_TRACT | Status: DC | PRN
  Filled 2017-01-24: qty 6.7

## 2017-01-24 MED ORDER — ALBUTEROL SULFATE (2.5 MG/3ML) 0.083% IN NEBU: 2.5000 mg | INHALATION_SOLUTION | Freq: Four times a day (QID) | RESPIRATORY_TRACT | 2 refills | Status: AC | PRN

## 2017-01-24 NOTE — ED Notes (Signed)
Patient transported to X-ray 

## 2017-01-24 NOTE — ED Provider Notes (Signed)
[facility] DEPT Provider Note   CSN: [id number] Arrival date & time: 01/23/17  2334     History   Chief Complaint Chief Complaint  Patient presents with  . Asthma    HPI Tammy French is a 21 y.o. female with hx of asthma presents to the ED with wheezing that started earlier tonight and has gotten progressively worse. Patient reports being out of her asthma medications. She denies fever or chills or flu-like-symptoms. She does feel short of breath.   The history is provided by the patient.  Asthma  This is a new problem. The current episode started 6 to 12 hours ago. The problem occurs constantly. The problem has been gradually worsening. Associated symptoms include shortness of breath. Pertinent negatives include no headaches. The symptoms are aggravated by walking. Nothing relieves the symptoms.    Past Medical History:  Diagnosis Date  . Asthma     There are no active problems to display for this patient.   History reviewed. No pertinent surgical history.  OB History    No data available       Home Medications    Prior to Admission medications   Medication Sig Start Date End Date Taking? Authorizing Provider  albuterol (PROVENTIL) (2.5 MG/3ML) 0.083% nebulizer solution Take 3 mLs (2.5 mg total) by nebulization every 6 (six) hours as needed for wheezing or shortness of breath. 01/24/17   Chandy Tarman Orlene Och, NP  beclomethasone (QVAR) 40 MCG/ACT inhaler Inhale 2 puffs into the lungs 2 (two) times daily as needed (SHORTNESS  OF BREATH).    Historical Provider, MD  docusate sodium (COLACE) 100 MG capsule Take 1 capsule (100 mg total) by mouth every 12 (twelve) hours. 09/14/16   Gilda Crease, MD  medroxyPROGESTERone (DEPO-PROVERA) 150 MG/ML injection Inject 150 mg into the muscle every 3 (three) months.    Historical Provider, MD  methocarbamol (ROBAXIN) 500 MG tablet Take 1 tablet (500 mg total) by mouth 2 (two) times daily. 04/25/16   Heather Laisure, PA-C    naproxen (NAPROSYN) 500 MG tablet Take 1 tablet (500 mg total) by mouth 2 (two) times daily. 04/25/16   Heather Laisure, PA-C  polyethylene glycol (MIRALAX / GLYCOLAX) packet Take 17 g by mouth daily as needed for moderate constipation.    Historical Provider, MD  predniSONE (DELTASONE) 50 MG tablet Take one tablet (50mg ) daily 01/24/17   Ezell Poke Orlene Och, NP  traMADol (ULTRAM) 50 MG tablet Take 1 tablet (50 mg total) by mouth every 6 (six) hours as needed. 09/14/16   Gilda Crease, MD    Family History Family History  Problem Relation Age of Onset  . Diabetes Maternal Grandmother     Social History Social History  Substance Use Topics  . Smoking status: Never Smoker  . Smokeless tobacco: Never Used  . Alcohol use No     Allergies   Patient has no known allergies.   Review of Systems Review of Systems  Constitutional: Negative for chills and fever.  HENT: Negative for congestion and sore throat.   Respiratory: Positive for shortness of breath and wheezing.   Gastrointestinal: Negative for nausea and vomiting.  Musculoskeletal: Negative for myalgias.  Skin: Negative for rash.  Neurological: Negative for dizziness and headaches.  Psychiatric/Behavioral: Negative for confusion.     Physical Exam Updated Vital Signs BP 102/58 (BP Location: Right Arm)   Pulse 75   Temp 98.5 F (36.9 C) (Oral)   Resp 18   Ht 5\' 6"  (1.676 m)  Wt 76.2 kg   SpO2 100%   BMI 27.12 kg/m   Physical Exam  Constitutional: She is oriented to person, place, and time. She appears well-developed and well-nourished. No distress.  HENT:  Head: Normocephalic and atraumatic.  Mouth/Throat: Uvula is midline, oropharynx is clear and moist and mucous membranes are normal.  Eyes: EOM are normal.  Neck: Neck supple.  Cardiovascular: Normal rate and regular rhythm.   Pulmonary/Chest: Effort normal. She has wheezes.  Musculoskeletal: Normal range of motion.  Neurological: She is alert and oriented  to person, place, and time. No cranial nerve deficit.  Skin: Skin is warm and dry.  Psychiatric: She has a normal mood and affect. Her behavior is normal.  Nursing note and vitals reviewed.    ED Treatments / Results  Labs (all labs ordered are listed, but only abnormal results are displayed) Labs Reviewed - No data to display  Radiology Dg Chest 2 View  Result Date: 01/24/2017 CLINICAL DATA:  History of asthma. Increased difficulty breathing for 2-3 days. Cold symptoms. Chest pain. EXAM: CHEST  2 VIEW COMPARISON:  01/18/2016 FINDINGS: The heart size and mediastinal contours are within normal limits. Both lungs are clear. The visualized skeletal structures are unremarkable. IMPRESSION: No active cardiopulmonary disease. Electronically Signed   By: Burman NievesWilliam  Stevens M.D.   On: 01/24/2017 02:11    Procedures Procedures (including critical care time)  Medications Ordered in ED Medications  albuterol (PROVENTIL HFA;VENTOLIN HFA) 108 (90 Base) MCG/ACT inhaler 2 puff (not administered)  albuterol (PROVENTIL) (2.5 MG/3ML) 0.083% nebulizer solution 5 mg (5 mg Nebulization Given 01/23/17 2347)  ipratropium (ATROVENT) nebulizer solution 0.5 mg (0.5 mg Nebulization Given 01/23/17 2347)  ipratropium-albuterol (DUONEB) 0.5-2.5 (3) MG/3ML nebulizer solution 3 mL (3 mLs Nebulization Given 01/24/17 0043)  predniSONE (DELTASONE) tablet 60 mg (60 mg Oral Given 01/24/17 0104)   After initial albuterol treatment at triage patient continues to feel short of breath. Duoneb, prednisone 60 mg and CXR ordered. After Duoneb patient feeling better Re examined and lungs are clear with good air movement.   Initial Impression / Assessment and Plan / ED Course  I have reviewed the triage vital signs and the nursing notes.  Pertinent imaging results that were available during my care of the patient were reviewed by me and considered in my medical decision making (see chart for details).   Final Clinical  Impressions(s) / ED Diagnoses  21 y.o. female with hx of asthma and out of medications here tonight with wheezing and shortness of breath that improved with treatment while in the ED. Stable for d/c with O2 SAT 100% on R/A while ambulating prior to d/c. Patient home medications refilled. Also short burst of steroids prescribed. Patient to f/u with her PCP or return here for worsening symptoms.   Final diagnoses:  Mild intermittent asthma with exacerbation    New Prescriptions New Prescriptions   ALBUTEROL (PROVENTIL) (2.5 MG/3ML) 0.083% NEBULIZER SOLUTION    Take 3 mLs (2.5 mg total) by nebulization every 6 (six) hours as needed for wheezing or shortness of breath.   PREDNISONE (DELTASONE) 50 MG TABLET    Take one tablet (50mg ) daily     [facility] M Keyunna Coco, NP 01/24/17 0244    Layla MawKristen N Ward, DO 01/24/17 [id number]

## 2017-01-24 NOTE — Discharge Instructions (Signed)
Follow up with your doctor. Return here for worsening symptoms.  °

## 2017-01-24 NOTE — ED Notes (Signed)
Pt stated she is having a harder time taking breaths.. Says it hurts a little to take a deep breath.

## 2017-01-24 NOTE — ED Notes (Signed)
Pt ambulated to the bathroom.  

## 2017-01-24 NOTE — ED Notes (Signed)
Pt ambulated with pulse oximeter; Sp02 100% for 100 ft. Pulse increased from 93 to 98bpm.

## 2017-03-09 ENCOUNTER — Ambulatory Visit (HOSPITAL_COMMUNITY)
Admission: EM | Admit: 2017-03-09 | Discharge: 2017-03-09 | Disposition: A | Discharge status: Home / Self Care | Payer: 59 | Attending: Internal Medicine | Admitting: Internal Medicine

## 2017-03-09 ENCOUNTER — Encounter (HOSPITAL_COMMUNITY): Payer: Self-pay | Admitting: Emergency Medicine

## 2017-03-09 DIAGNOSIS — K219 Gastro-esophageal reflux disease without esophagitis: Secondary | ICD-10-CM | POA: Diagnosis not present

## 2017-03-09 MED ORDER — BENZONATATE 100 MG PO CAPS: 100.0000 mg | ORAL_CAPSULE | Freq: Three times a day (TID) | ORAL | 0 refills | Status: DC

## 2017-03-09 MED ORDER — OMEPRAZOLE 40 MG PO CPDR: 40.0000 mg | DELAYED_RELEASE_CAPSULE | Freq: Every day | ORAL | 0 refills | Status: DC

## 2017-03-09 MED ORDER — RANITIDINE HCL 150 MG PO TABS: 150.0000 mg | ORAL_TABLET | Freq: Two times a day (BID) | ORAL | 3 refills | Status: DC

## 2017-03-09 NOTE — ED Triage Notes (Signed)
Pt complains of chest tightness for one week and a new cough two days ago.

## 2017-03-09 NOTE — ED Provider Notes (Signed)
CSN: [id number]     Arrival date & time 03/09/17  1810 History   First MD Initiated Contact with Patient 03/09/17 1939     Chief Complaint  Patient presents with  . Cough   (Consider location/radiation/quality/duration/timing/severity/associated sxs/prior Treatment) 21 year old female presents with a one-week history of chest tightness, and a 2 day history of cough. Regard to your chest tightness, worse at night, states she has a sensation of food, or other fluids in the back of her throat at night, accompanied with occasional sore throat.   The history is provided by the patient.  Cough  Cough characteristics:  Non-productive, dry, nocturnal and supine Sputum characteristics:  Clear Severity:  Moderate Onset quality:  Gradual Duration:  2 days Timing:  Intermittent Progression:  Worsening Chronicity:  New Smoker: no   Context: not upper respiratory infection   Relieved by:  None tried Worsened by:  Nothing Ineffective treatments:  None tried Associated symptoms: chest pain   Associated symptoms: no chills, no diaphoresis, no fever, no headaches, no myalgias, no rash, no rhinorrhea, no shortness of breath, no sinus congestion, no sore throat and no wheezing     Past Medical History:  Diagnosis Date  . Asthma    History reviewed. No pertinent surgical history. Family History  Problem Relation Age of Onset  . Diabetes Maternal Grandmother    Social History  Substance Use Topics  . Smoking status: Never Smoker  . Smokeless tobacco: Never Used  . Alcohol use No   OB History    No data available     Review of Systems  Constitutional: Negative for chills, diaphoresis and fever.  HENT: Negative for rhinorrhea and sore throat.   Respiratory: Positive for cough. Negative for shortness of breath and wheezing.   Cardiovascular: Positive for chest pain.  Gastrointestinal: Negative for abdominal pain, constipation, diarrhea, nausea and vomiting.  Musculoskeletal: Negative  for myalgias, neck pain and neck stiffness.  Skin: Negative for rash.  Neurological: Negative for headaches.  All other systems reviewed and are negative.   Allergies  Patient has no known allergies.  Home Medications   Prior to Admission medications   Medication Sig Start Date End Date Taking? Authorizing Provider  albuterol (PROVENTIL HFA;VENTOLIN HFA) 108 (90 Base) MCG/ACT inhaler Inhale 2 puffs into the lungs every 6 (six) hours as needed for wheezing or shortness of breath.   Yes Historical Provider, MD  albuterol (PROVENTIL) (2.5 MG/3ML) 0.083% nebulizer solution Take 3 mLs (2.5 mg total) by nebulization every 6 (six) hours as needed for wheezing or shortness of breath. 01/24/17  Yes Hope Orlene OchM Neese, NP  beclomethasone (QVAR) 40 MCG/ACT inhaler Inhale 2 puffs into the lungs 2 (two) times daily as needed (SHORTNESS  OF BREATH).   Yes Historical Provider, MD  benzonatate (TESSALON) 100 MG capsule Take 1 capsule (100 mg total) by mouth every 8 (eight) hours. 03/09/17   Dorena BodoLawrence Rai Severns, NP  docusate sodium (COLACE) 100 MG capsule Take 1 capsule (100 mg total) by mouth every 12 (twelve) hours. 09/14/16   Gilda Creasehristopher J Pollina, MD  methocarbamol (ROBAXIN) 500 MG tablet Take 1 tablet (500 mg total) by mouth 2 (two) times daily. 04/25/16   Heather Laisure, PA-C  naproxen (NAPROSYN) 500 MG tablet Take 1 tablet (500 mg total) by mouth 2 (two) times daily. 04/25/16   Heather Laisure, PA-C  omeprazole (PRILOSEC) 40 MG capsule Take 1 capsule (40 mg total) by mouth daily. 03/09/17   Dorena BodoLawrence Nailah Luepke, NP  polyethylene glycol [facility](MIRALAX / Ethelene HalGLYCOLAX)  packet Take 17 g by mouth daily as needed for moderate constipation.    Historical Provider, MD  ranitidine (ZANTAC) 150 MG tablet Take 1 tablet (150 mg total) by mouth 2 (two) times daily. 03/09/17   Dorena Bodo, NP  traMADol (ULTRAM) 50 MG tablet Take 1 tablet (50 mg total) by mouth every 6 (six) hours as needed. 09/14/16   Gilda Crease, MD   Meds  Ordered and Administered this Visit  Medications - No data to display  BP 124/75 (BP Location: Right Arm)   Pulse 66   Temp 98.3 F (36.8 C) (Oral)   SpO2 100%  No data found.   Physical Exam  Constitutional: She is oriented to person, place, and time. She appears well-developed and well-nourished. No distress.  HENT:  Head: Normocephalic and atraumatic.  Neck: Normal range of motion. Neck supple. No JVD present.  Cardiovascular: Normal rate and regular rhythm.   Pulmonary/Chest: Effort normal and breath sounds normal.  Abdominal: Soft. Bowel sounds are normal. She exhibits no distension. There is no tenderness. There is no guarding.  Neurological: She is alert and oriented to person, place, and time.  Skin: Skin is warm and dry. Capillary refill takes less than 2 seconds. She is not diaphoretic. No pallor.  Psychiatric: She has a normal mood and affect. Her behavior is normal.  Nursing note and vitals reviewed.   Urgent Care Course     Procedures (including critical care time)  Labs Review Labs Reviewed - No data to display  Imaging Review No results found.    MDM   1. Gastroesophageal reflux disease without esophagitis     Symptoms are consistent with reflux. Started on Prilosec, Zantac, given prescription for Tessalon for cough. Recommended she follow up with primary care for symptoms persist.    Dorena Bodo, NP 03/09/17 1956

## 2017-03-09 NOTE — Discharge Instructions (Signed)
The signs and symptoms you've described today are consistent with acid reflux. Prescribed 2 medications for your symptoms. The first is omeprazole, take one tablet daily by mouth. Second is Zantac, take one tablet twice a day.For cough, I have prescribed a medication called Tessalon. Take 1 tablet every 8 hours as needed for your cough. Should your symptoms fail to resolve, or at anytime worsen, follow up with your primary care provider, or return to clinic as needed.

## 2017-04-17 DIAGNOSIS — N760 Acute vaginitis: Secondary | ICD-10-CM | POA: Diagnosis not present

## 2017-04-21 DIAGNOSIS — R109 Unspecified abdominal pain: Secondary | ICD-10-CM | POA: Diagnosis not present

## 2017-05-07 ENCOUNTER — Encounter (HOSPITAL_COMMUNITY): Payer: Self-pay | Admitting: *Deleted

## 2017-05-07 ENCOUNTER — Emergency Department (HOSPITAL_COMMUNITY)
Admission: EM | Admit: 2017-05-07 | Discharge: 2017-05-07 | Disposition: A | Discharge status: Home / Self Care | Payer: 59 | Attending: Emergency Medicine | Admitting: Emergency Medicine

## 2017-05-07 DIAGNOSIS — Z79899 Other long term (current) drug therapy: Secondary | ICD-10-CM | POA: Insufficient documentation

## 2017-05-07 DIAGNOSIS — R630 Anorexia: Secondary | ICD-10-CM | POA: Diagnosis not present

## 2017-05-07 DIAGNOSIS — A084 Viral intestinal infection, unspecified: Secondary | ICD-10-CM

## 2017-05-07 DIAGNOSIS — J45909 Unspecified asthma, uncomplicated: Secondary | ICD-10-CM | POA: Insufficient documentation

## 2017-05-07 DIAGNOSIS — R112 Nausea with vomiting, unspecified: Secondary | ICD-10-CM | POA: Diagnosis not present

## 2017-05-07 DIAGNOSIS — R1031 Right lower quadrant pain: Secondary | ICD-10-CM | POA: Diagnosis present

## 2017-05-07 LAB — LIPASE, BLOOD: Lipase: 26 U/L (ref 11–51)

## 2017-05-07 LAB — URINALYSIS, ROUTINE W REFLEX MICROSCOPIC
Bilirubin Urine: NEGATIVE
Glucose, UA: NEGATIVE mg/dL
Ketones, ur: NEGATIVE mg/dL
NITRITE: NEGATIVE
Protein, ur: NEGATIVE mg/dL
SPECIFIC GRAVITY, URINE: 1.018 (ref 1.005–1.030)
pH: 6 (ref 5.0–8.0)

## 2017-05-07 LAB — CBC
HEMATOCRIT: 43.1 % (ref 36.0–46.0)
HEMOGLOBIN: 14 g/dL (ref 12.0–15.0)
MCH: 28.5 pg (ref 26.0–34.0)
MCHC: 32.5 g/dL (ref 30.0–36.0)
MCV: 87.8 fL (ref 78.0–100.0)
Platelets: 254 10*3/uL (ref 150–400)
RBC: 4.91 MIL/uL (ref 3.87–5.11)
RDW: 13 % (ref 11.5–15.5)
WBC: 10.1 10*3/uL (ref 4.0–10.5)

## 2017-05-07 LAB — COMPREHENSIVE METABOLIC PANEL
ALBUMIN: 4.7 g/dL (ref 3.5–5.0)
ALK PHOS: 49 U/L (ref 38–126)
ALT: 16 U/L (ref 14–54)
ANION GAP: 9 (ref 5–15)
AST: 20 U/L (ref 15–41)
BILIRUBIN TOTAL: 0.5 mg/dL (ref 0.3–1.2)
BUN: 7 mg/dL (ref 6–20)
CALCIUM: 9.5 mg/dL (ref 8.9–10.3)
CO2: 25 mmol/L (ref 22–32)
Chloride: 105 mmol/L (ref 101–111)
Creatinine, Ser: 0.62 mg/dL (ref 0.44–1.00)
GFR calc Af Amer: >60 mL/min (ref >60)
GLUCOSE: 75 mg/dL (ref 65–99)
POTASSIUM: 3.7 mmol/L (ref 3.5–5.1)
Sodium: 139 mmol/L (ref 135–145)
TOTAL PROTEIN: 7.9 g/dL (ref 6.5–8.1)

## 2017-05-07 LAB — PREGNANCY, URINE: PREG TEST UR: NEGATIVE

## 2017-05-07 LAB — I-STAT BETA HCG BLOOD, ED (MC, WL, AP ONLY): I-stat hCG, quantitative: <5 m[IU]/mL (ref <5)

## 2017-05-07 MED ORDER — ONDANSETRON 4 MG PO TBDP: 4.0000 mg | ORAL_TABLET | Freq: Three times a day (TID) | ORAL | 0 refills | Status: DC | PRN

## 2017-05-07 MED ORDER — LOPERAMIDE HCL 2 MG PO CAPS: 2.0000 mg | ORAL_CAPSULE | Freq: Four times a day (QID) | ORAL | 0 refills | Status: DC | PRN

## 2017-05-07 NOTE — Discharge Instructions (Signed)
Take your medications as prescribed as needed for nausea, vomiting and diarrhea. Continue drinking water at home to remain hydrated. Take your prescription of ciprofloxacin as prescribed until completed 4 year UTI. I recommend following up with your primary care provider in 3-4 days for follow-up evaluation. Return to the emergency department if symptoms worsen or new onset of fever, chest pain, difficulty breathing, new/worsening abdominal pain, vomiting and unable to keep fluids down, blood in emesis or stool, vaginal discharge, flank pain.

## 2017-05-07 NOTE — ED Provider Notes (Signed)
[facility] Provider Note   By signing my name below, I, Earmon Phoenix, attest that this documentation has been prepared under the direction and in the presence of Melburn Hake, PA-C. Electronically Signed: Earmon Phoenix, ED Scribe. 05/07/17. 3:22 PM.    History   Chief Complaint Chief Complaint  Patient presents with  . Abdominal Pain   HPI  Tammy French is a 21 y.o. female who presents to the Emergency Department complaining of non radiating, constant, dull aching RLQ pain that began one week ago. She reports associated intermittent sharp pain, nonbloody diarrhea (5 episodes), decreased appetite, nausea and NBNB vomiting x2. 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 She was prescribed Cipro for elevated WBC in UA but has not started taking them yet because she just had them filled today. She has not taken anything for pain and notes her nausea has resolved since arrival to the ED. There are no modifying factors noted She denies hematemesis, hematochezia, fever, chills, SOB, CP, vaginal d/c, dysuria or hematuria. LMP began 04/24/17 and is currently menstruating. She states she was on Depo Provera and is just now starting to get her periods again. She denies any previous abdominal surgeries. She denies any recent travel or recent hospitalizations. She denies drinking from any bodies of water. She reports being on antibiotics in the last two weeks for strep throat.     Past Medical History:  Diagnosis Date  . Asthma     There are no active problems to display for this patient.   History reviewed. No pertinent surgical history.  OB History    No data available       Home Medications    Prior to Admission medications   Medication Sig Start Date End Date Taking? Authorizing Provider  albuterol (PROVENTIL HFA;VENTOLIN HFA) 108 (90 Base) MCG/ACT inhaler Inhale 2 puffs into the lungs every 6 (six) hours as needed for wheezing or shortness  of breath.    [provider]  albuterol (PROVENTIL) (2.5 MG/3ML) 0.083% nebulizer solution Take 3 mLs (2.5 mg total) by nebulization every 6 (six) hours as needed for wheezing or shortness of breath. 01/24/17   Janne Napoleon, NP  beclomethasone (QVAR) 40 MCG/ACT inhaler Inhale 2 puffs into the lungs 2 (two) times daily as needed (SHORTNESS  OF BREATH).    [provider]  benzonatate (TESSALON) 100 MG capsule Take 1 capsule (100 mg total) by mouth every 8 (eight) hours. 03/09/17   Dorena Bodo, NP  docusate sodium (COLACE) 100 MG capsule Take 1 capsule (100 mg total) by mouth every 12 (twelve) hours. 09/14/16   Gilda Crease, MD  loperamide (IMODIUM) 2 MG capsule Take 1 capsule (2 mg total) by mouth 4 (four) times daily as needed for diarrhea or loose stools. 05/07/17   Barrett Henle, PA-C  methocarbamol (ROBAXIN) 500 MG tablet Take 1 tablet (500 mg total) by mouth 2 (two) times daily. 04/25/16   Santiago Glad, PA-C  naproxen (NAPROSYN) 500 MG tablet Take 1 tablet (500 mg total) by mouth 2 (two) times daily. 04/25/16   Santiago Glad, PA-C  omeprazole (PRILOSEC) 40 MG capsule Take 1 capsule (40 mg total) by mouth daily. 03/09/17   Dorena Bodo, NP  ondansetron (ZOFRAN ODT) 4 MG disintegrating tablet Take 1 tablet (4 mg total) by mouth every 8 (eight) hours as needed for nausea or vomiting. 05/07/17   Barrett Henle, PA-C  polyethylene glycol [facility] / GLYCOLAX) packet Take 17  g by mouth daily as needed for moderate constipation.    [provider]  ranitidine (ZANTAC) 150 MG tablet Take 1 tablet (150 mg total) by mouth 2 (two) times daily. 03/09/17   Dorena BodoKennard, Lawrence, NP  traMADol (ULTRAM) 50 MG tablet Take 1 tablet (50 mg total) by mouth every 6 (six) hours as needed. 09/14/16   Gilda CreasePollina, Christopher J, MD    Family History Family History  Problem Relation Age of Onset  . Diabetes Maternal Grandmother     Social History Social  History  Substance Use Topics  . Smoking status: Never Smoker  . Smokeless tobacco: Never Used  . Alcohol use No     Allergies   Patient has no known allergies.   Review of Systems Review of Systems  Constitutional: Positive for appetite change. Negative for chills and fever.  Respiratory: Negative for shortness of breath.   Cardiovascular: Negative for chest pain.  Gastrointestinal: Positive for abdominal pain, diarrhea, nausea and vomiting. Negative for blood in stool.  Genitourinary: Negative for dysuria and hematuria.     Physical Exam Updated Vital Signs BP 130/81 (BP Location: Left Arm)   Pulse 82   Temp 99.2 F (37.3 C) (Oral)   Resp 18   LMP 04/24/2017   SpO2 98%   Physical Exam  Constitutional: She is oriented to person, place, and time. She appears well-developed and well-nourished. No distress.  HENT:  Head: Normocephalic and atraumatic.  Mouth/Throat: Uvula is midline, oropharynx is clear and moist and mucous membranes are normal. No oropharyngeal exudate.  Eyes: Conjunctivae and EOM are normal. Right eye exhibits no discharge. Left eye exhibits no discharge. No scleral icterus.  Neck: Normal range of motion. Neck supple.  Cardiovascular: Normal rate, regular rhythm, normal heart sounds and intact distal pulses.   Pulmonary/Chest: Effort normal and breath sounds normal. No respiratory distress. She has no wheezes. She has no rales. She exhibits no tenderness.  Abdominal: Soft. Normal appearance and bowel sounds are normal. She exhibits no distension and no mass. There is tenderness. There is no rigidity, no rebound, no guarding and no CVA tenderness. No hernia.  Mild intermittent TTP over suprapubic region and RLQ.  Musculoskeletal: Normal range of motion. She exhibits no edema.  Neurological: She is alert and oriented to person, place, and time.  Skin: Skin is warm and dry. She is not diaphoretic.  Nursing note and vitals reviewed.    ED Treatments /  Results  DIAGNOSTIC STUDIES: Oxygen Saturation is 98% on RA, normal by my interpretation.   COORDINATION OF CARE: 2:40 PM- Will wait for labs and urinalysis to result. Pt verbalizes understanding and agrees to plan.   Medications - No data to display  Labs (all labs ordered are listed, but only abnormal results are displayed) Labs Reviewed  URINALYSIS, ROUTINE W REFLEX MICROSCOPIC - Abnormal; Notable for the following:       Result Value   APPearance HAZY (*)    Hgb urine dipstick LARGE (*)    Leukocytes, UA SMALL (*)    Bacteria, UA RARE (*)    Squamous Epithelial / LPF 0-5 (*)    Non Squamous Epithelial 0-5 (*)    All other components within normal limits  LIPASE, BLOOD  COMPREHENSIVE METABOLIC PANEL  CBC  PREGNANCY, URINE  I-STAT BETA HCG BLOOD, ED ([facility], [facility], [facility] ONLY)    EKG  EKG Interpretation None       Radiology No results found.  Procedures Procedures (including critical care time)  Medications Ordered in ED Medications - No data to display   Initial Impression / Assessment and Plan / ED Course  I have reviewed the triage vital signs and the nursing notes.  Pertinent labs & imaging results that were available during my care of the patient were reviewed by me and considered in my medical decision making (see chart for details).     Patient presents with right lower.vital pain with associated nausea, vomiting and diarrhea for the past week. Denies fever. Patient states she was seen earlier this week by a physician at her lower fever prescription for Cipro on UTI, was reevaluated today and advised to come to the ED for CT scan. VSS. Exam revealed mild intermittent tenderness over right lower quadrant and suprapubic region, no peritoneal signs. No CVA tenderness. Pregnancy negative. UA with large hemoglobin, small leukocytes, 6-30 WBCs and rare bacteria; denies urinary sxs and reports currently being on her menstrual cycle. Remaining labs unremarkable, no  leukocytosis.  3:19 PM- I had a thorough discussion with pt regarding results and evaluation in ED. I feel it is very unlikely her symptoms would be due to appendicitis, diverticulitis, kidney stones or pyelonephritis as her symptoms have remained constant for the past week and she has remained afebrile with no leukocytosis. I discussed option for CT scan with pt. She reported understanding and declined scan. Suspect pt symptoms are likely due to viral gastroenteritis as she reports she has been working around someone with similar symptoms this week. Plan to discharge with symptomatic treatment and close PCP follow up. Advised patient to take her antibiotic as prescribed for her UTI. Pt tolerating PO in the ED. discussed return precautions.  Final Clinical Impressions(s) / ED Diagnoses   Final diagnoses:  Viral gastroenteritis    New Prescriptions New Prescriptions   LOPERAMIDE (IMODIUM) 2 MG CAPSULE    Take 1 capsule (2 mg total) by mouth 4 (four) times daily as needed for diarrhea or loose stools.   ONDANSETRON (ZOFRAN ODT) 4 MG DISINTEGRATING TABLET    Take 1 tablet (4 mg total) by mouth every 8 (eight) hours as needed for nausea or vomiting.    I personally performed the services described in this documentation, which was scribed in my presence. The recorded information has been reviewed and is accurate.     Barrett Henle, PA-C 05/07/17 1535    [address] DO 05/07/17 1544

## 2017-05-07 NOTE — ED Triage Notes (Signed)
To ED for eval of RLQ pain for past week. 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 Dx with UTI. Picked up script for Cipro prior to coming to ED. States the doctor at work wants pt to have CT Scan. Nausea and vomiting. Appears in nad

## 2017-05-07 NOTE — ED Notes (Signed)
ED Provider at bedside. 

## 2017-05-11 DIAGNOSIS — S0502XA Injury of conjunctiva and corneal abrasion without foreign body, left eye, initial encounter: Secondary | ICD-10-CM | POA: Diagnosis not present

## 2017-05-14 DIAGNOSIS — H1032 Unspecified acute conjunctivitis, left eye: Secondary | ICD-10-CM | POA: Diagnosis not present

## 2017-05-15 DIAGNOSIS — B309 Viral conjunctivitis, unspecified: Secondary | ICD-10-CM | POA: Diagnosis not present

## 2017-05-21 DIAGNOSIS — B309 Viral conjunctivitis, unspecified: Secondary | ICD-10-CM | POA: Diagnosis not present

## 2017-06-29 ENCOUNTER — Encounter: Payer: Self-pay | Admitting: Gastroenterology

## 2017-08-21 ENCOUNTER — Ambulatory Visit (INDEPENDENT_AMBULATORY_CARE_PROVIDER_SITE_OTHER): Payer: 59 | Admitting: Gastroenterology

## 2017-08-21 ENCOUNTER — Other Ambulatory Visit: Payer: Self-pay

## 2017-08-21 ENCOUNTER — Encounter: Payer: Self-pay | Admitting: Gastroenterology

## 2017-08-21 VITALS — BP 118/60 | HR 70 | Ht 65.5 in | Wt 160.0 lb

## 2017-08-21 DIAGNOSIS — K5900 Constipation, unspecified: Secondary | ICD-10-CM | POA: Diagnosis not present

## 2017-08-21 MED ORDER — OMEPRAZOLE 40 MG PO CPDR: 40.0000 mg | DELAYED_RELEASE_CAPSULE | Freq: Every day | ORAL | 3 refills | Status: DC

## 2017-08-21 MED ORDER — NA SULFATE-K SULFATE-MG SULF 17.5-3.13-1.6 GM/177ML PO SOLN: 1.0000 | Freq: Once | ORAL | 0 refills | Status: AC

## 2017-08-21 NOTE — Progress Notes (Signed)
HPI: This is a very pleasant 21 year old woman who was self-referred  Chief complaint is constipation  For past 2 years; bloating, feels urge to have BM. She is pretty irregular with her bowels.  SHe's tried daily miralax with out help.  Constipation; can have no BM for 3-4 weeks.  She becomes increasingly, uncomfortable until she finally has a bowel movement and then will be on the toilet for hours. When she does move her bowels her abdominal discomforts improved quickly.  She's tried stool softners,  Uses probiotics  Tried a colon clens.  Tried to eat more green, healthy foods.  Overall her weight has been fluctuates.  No FH of colon cancer.  She has rarely seen blood in her stool  Never on pain meds      Review of systems: Pertinent positive and negative review of systems were noted in the above HPI section. All other review negative.   Past Medical History:  Diagnosis Date  . Asthma   . GERD (gastroesophageal reflux disease)   . Migraines     Past Surgical History:  Procedure Laterality Date  . NO PAST SURGERIES      Current Outpatient Prescriptions  Medication Sig Dispense Refill  . albuterol (PROVENTIL HFA;VENTOLIN HFA) 108 (90 Base) MCG/ACT inhaler Inhale 2 puffs into the lungs every 6 (six) hours as needed for wheezing or shortness of breath.    Marland Kitchen. albuterol (PROVENTIL) (2.5 MG/3ML) 0.083% nebulizer solution Take 3 mLs (2.5 mg total) by nebulization every 6 (six) hours as needed for wheezing or shortness of breath. 75 mL 2  . beclomethasone (QVAR) 40 MCG/ACT inhaler Inhale 2 puffs into the lungs 2 (two) times daily as needed (SHORTNESS  OF BREATH).    Marland Kitchen. omeprazole (PRILOSEC) 40 MG capsule Take 1 capsule (40 mg total) by mouth daily. 14 capsule 0  . ranitidine (ZANTAC) 150 MG tablet Take 1 tablet (150 mg total) by mouth 2 (two) times daily. 60 tablet 3   No current facility-administered medications for this visit.     Allergies as of 08/21/2017  . (No  Known Allergies)    Family History  Problem Relation Age of Onset  . Diabetes Maternal Grandmother   . Colon cancer Neg Hx     Social History   Social History  . Marital status: Single    Spouse name: N/A  . Number of children: 0  . Years of education: N/A   Occupational History  . medical assisant    Social History Main Topics  . Smoking status: Never Smoker  . Smokeless tobacco: Never Used  . Alcohol use No  . Drug use: No  . Sexual activity: Not on file   Other Topics Concern  . Not on file   Social History Narrative  . No narrative on file     Physical Exam: BP 118/60   Pulse 70   Ht 5' 5.5" (1.664 m)   Wt 160 lb (72.6 kg)   BMI 26.22 kg/m  Constitutional: generally well-appearing Psychiatric: alert and oriented x3 Eyes: extraocular movements intact Mouth: oral pharynx moist, no lesions Neck: supple no lymphadenopathy Cardiovascular: heart regular rate and rhythm Lungs: clear to auscultation bilaterally Abdomen: soft, nontender, nondistended, no obvious ascites, no peritoneal signs, normal bowel sounds Extremities: no lower extremity edema bilaterally Skin: no lesions on visible extremities   Assessment and plan: 21 y.o. female with  Significant constipation, minor intermittent rectal bleeding  First I recommended colonoscopy for her constipation and her minor rectal bleeding.  I think it is unlikely that she has neoplasm but we need to be more certain. She is going to do a MiraLAX, Gatorade purge and then after that she will start to doses of MiraLAX every day in addition to a fiber supplement every day. If that regimen is not helpful I will probably start her on either Linzess or amitiza.   Please see the "Patient Instructions" section for addition details about the plan.   Rob Bunting, MD [facility] 08/21/2017, 2:55 PM  Cc: Maurice Small, MD

## 2017-08-21 NOTE — Patient Instructions (Addendum)
Miralax/gatorade purge.  Dr Christella HartiganJacobs recommends that you complete a bowel purge (to clean out your bowels). Please do the following:  Purchase a bottle of Miralax over the counter as well as a box of 5 mg dulcolax tablets. Take 4 dulcolax tablets. Wait 1 hour. You will then drink 6-8 capfuls of Miralax mixed in an adequate amount of water/juice/gatorade (you may choose which of these liquids to drink) over the next 2-3 hours. You should expect results within 1 to 6 hours after completing the bowel purge.  After this start miralax two doses every day and also start taking citrucel (orange flavored) powder fiber supplement.  This may cause some bloating at first but that usually goes away. Begin with a small spoonful and work your way up to a large, heaping spoonful daily over a week.  You will be set up for a colonoscopy for constipation.  Normal BMI (Body Mass Index- based on height and weight) is between 19 and 25. Your BMI today is Body mass index is 26.22 kg/m. Marland Kitchen. Please consider follow up  regarding your BMI with your Primary Care Provider.

## 2017-09-07 ENCOUNTER — Emergency Department (HOSPITAL_COMMUNITY): Admission: EM | Admit: 2017-09-07 | Discharge: 2017-09-07 | Discharge status: Against Medical Advice | Payer: 59

## 2017-09-25 ENCOUNTER — Encounter: Payer: Self-pay | Admitting: Gastroenterology

## 2017-09-30 ENCOUNTER — Telehealth: Payer: Self-pay | Admitting: Gastroenterology

## 2017-09-30 MED ORDER — SUPREP BOWEL PREP KIT 17.5-3.13-1.6 GM/177ML PO SOLN: 1.0000 | ORAL | 0 refills | Status: DC

## 2017-09-30 NOTE — Telephone Encounter (Signed)
Suprep kit sent to [facility]

## 2017-10-09 ENCOUNTER — Ambulatory Visit (AMBULATORY_SURGERY_CENTER): Payer: 59 | Admitting: Gastroenterology

## 2017-10-09 ENCOUNTER — Encounter: Payer: Self-pay | Admitting: Gastroenterology

## 2017-10-09 VITALS — BP 116/84 | HR 70 | Temp 98.0°F | Resp 14 | Ht 65.5 in | Wt 160.0 lb

## 2017-10-09 DIAGNOSIS — K5900 Constipation, unspecified: Secondary | ICD-10-CM

## 2017-10-09 MED ORDER — SODIUM CHLORIDE 0.9 % IV SOLN: 500.0000 mL | INTRAVENOUS | Status: DC

## 2017-10-09 MED ORDER — LUBIPROSTONE 8 MCG PO CAPS: 8.0000 ug | ORAL_CAPSULE | Freq: Two times a day (BID) | ORAL | 5 refills | Status: DC

## 2017-10-09 NOTE — Op Note (Signed)
[facility] Patient Name: Tammy French Procedure Date: 10/09/2017 3:08 PM MRN: [id number] Endoscopist: Rachael Fee , MD Age: 21 Referring MD:  Date of Birth: 02-07-1996 Gender: Female Account #: [id number] Procedure:                Colonoscopy Indications:              Constipation Medicines:                Monitored Anesthesia Care Procedure:                Pre-Anesthesia Assessment:                           - Prior to the procedure, a History and Physical                            was performed, and patient medications and                            allergies were reviewed. The patient's tolerance of                            previous anesthesia was also reviewed. The risks                            and benefits of the procedure and the sedation                            options and risks were discussed with the patient.                            All questions were answered, and informed consent                            was obtained. Prior Anticoagulants: The patient has                            taken no previous anticoagulant or antiplatelet                            agents. ASA Grade Assessment: II - A patient with                            mild systemic disease. After reviewing the risks                            and benefits, the patient was deemed in                            satisfactory condition to undergo the procedure.                           After obtaining informed consent, the colonoscope  was passed under direct vision. Throughout the                            procedure, the patient's blood pressure, pulse, and                            oxygen saturations were monitored continuously. The                            Model CF-HQ190L [id number]) scope was introduced                            through the anus and advanced to the the cecum,                            identified by appendiceal orifice and  ileocecal                            valve. The colonoscopy was performed without                            difficulty. The patient tolerated the procedure                            well. The quality of the bowel preparation was                            excellent. The ileocecal valve, appendiceal                            orifice, and rectum were photographed. Scope In: 3:29:26 PM Scope Out: 3:40:53 PM Scope Withdrawal Time: 0 hours 7 minutes 52 seconds  Total Procedure Duration: 0 hours 11 minutes 27 seconds  Findings:                 The entire examined colon appeared normal on direct                            and retroflexion views.                           No polyps or cancers. Complications:            No immediate complications. Estimated blood loss:                            None. Estimated Blood Loss:     Estimated blood loss: none. Impression:               - The entire examined colon is normal on direct and                            retroflexion views.                           - No polyps or cancers. Recommendation:           -  Patient has a contact number available for                            emergencies. The signs and symptoms of potential                            delayed complications were discussed with the                            patient. Return to normal activities tomorrow.                            Written discharge instructions were provided to the                            patient.                           - Resume previous diet.                           - Continue present medications. Start new medicine                            Amitiza pills, one pill twice daily with food,                            disp 60, 5 refills (new script called in today).                           - Stay on 2 doses of miralax and one dose of                            citrucel as well (every day).                           - ROV with Dr. Christella Hartigan in 2 months to  discuss your                            response to the above.                           - Repeat colonoscopy at age 39 for screening                            purposes. Rachael Fee, MD 10/09/2017 3:46:56 PM This report has been signed electronically.

## 2017-10-09 NOTE — Patient Instructions (Signed)
Discharge instructions given. Normal exam. Resume previous medications. YOU HAD AN ENDOSCOPIC PROCEDURE TODAY AT THE [facility]:   Refer to the procedure report that was given to you for any specific questions about what was found during the examination.  If the procedure report does not answer your questions, please call your gastroenterologist to clarify.  If you requested that your care partner not be given the details of your procedure findings, then the procedure report has been included in a sealed envelope for you to review at your convenience later.  YOU SHOULD EXPECT: Some feelings of bloating in the abdomen. Passage of more gas than usual.  Walking can help get rid of the air that was put into your GI tract during the procedure and reduce the bloating. If you had a lower endoscopy (such as a colonoscopy or flexible sigmoidoscopy) you may notice spotting of blood in your stool or on the toilet paper. If you underwent a bowel prep for your procedure, you may not have a normal bowel movement for a few days.  Please Note:  You might notice some irritation and congestion in your nose or some drainage.  This is from the oxygen used during your procedure.  There is no need for concern and it should clear up in a day or so.  SYMPTOMS TO REPORT IMMEDIATELY:   Following lower endoscopy (colonoscopy or flexible sigmoidoscopy):  Excessive amounts of blood in the stool  Significant tenderness or worsening of abdominal pains  Swelling of the abdomen that is new, acute  Fever of 100F or higher   For urgent or emergent issues, a gastroenterologist can be reached at any hour by calling [phone].   DIET:  We do recommend a small meal at first, but then you may proceed to your regular diet.  Drink plenty of fluids but you should avoid alcoholic beverages for 24 hours.  ACTIVITY:  You should plan to take it easy for the rest of today and you should NOT DRIVE or use heavy machinery  until tomorrow (because of the sedation medicines used during the test).    FOLLOW UP: Our staff will call the number listed on your records the next business day following your procedure to check on you and address any questions or concerns that you may have regarding the information given to you following your procedure. If we do not reach you, we will leave a message.  However, if you are feeling well and you are not experiencing any problems, there is no need to return our call.  We will assume that you have returned to your regular daily activities without incident.  If any biopsies were taken you will be contacted by phone or by letter within the next 1-3 weeks.  Please call us at [phone] if you have not heard about the biopsies in 3 weeks.    SIGNATURES/CONFIDENTIALITY: You and/or your care partner have signed paperwork which will be entered into your electronic medical record.  These signatures attest to the fact that that the information above on your After Visit Summary has been reviewed and is understood.  Full responsibility of the confidentiality of this discharge information lies with you and/or your care-partner. 

## 2017-10-09 NOTE — Progress Notes (Signed)
Report to PACU, RN, vss, BBS= Clear.  

## 2017-10-12 ENCOUNTER — Telehealth: Payer: Self-pay | Admitting: *Deleted

## 2017-10-12 NOTE — Telephone Encounter (Signed)
No answer for post procedure call back left message for patient to call with questions or concerns. Sm 

## 2017-10-12 NOTE — Telephone Encounter (Signed)
No answer for post procedure call back. Will attempt to call back later this afternoon. Message left with this information. SM

## 2017-10-30 ENCOUNTER — Telehealth: Payer: Self-pay | Admitting: Gastroenterology

## 2017-10-30 NOTE — Telephone Encounter (Signed)
The pt states she has some rectal burning after BM, she has some history of constipation but amitiza has helped.  She was advised to keep her bowels soft, clean the area gently, use over the counter hydrocortisone cream, sitz baths and we will put her on the wait list.  She will call if the pain worsens or she starts to see blood or other symptoms.  Her colon in October was normal.

## 2017-12-21 ENCOUNTER — Ambulatory Visit: Payer: 59 | Admitting: Gastroenterology

## 2017-12-31 DIAGNOSIS — J019 Acute sinusitis, unspecified: Secondary | ICD-10-CM | POA: Diagnosis not present

## 2018-01-07 DIAGNOSIS — Z0000 Encounter for general adult medical examination without abnormal findings: Secondary | ICD-10-CM | POA: Diagnosis not present

## 2018-01-07 DIAGNOSIS — Z833 Family history of diabetes mellitus: Secondary | ICD-10-CM | POA: Diagnosis not present

## 2018-01-07 DIAGNOSIS — Z23 Encounter for immunization: Secondary | ICD-10-CM | POA: Diagnosis not present

## 2018-01-07 DIAGNOSIS — J45909 Unspecified asthma, uncomplicated: Secondary | ICD-10-CM | POA: Diagnosis not present

## 2018-01-25 DIAGNOSIS — N915 Oligomenorrhea, unspecified: Secondary | ICD-10-CM | POA: Diagnosis not present

## 2018-01-25 DIAGNOSIS — Z01419 Encounter for gynecological examination (general) (routine) without abnormal findings: Secondary | ICD-10-CM | POA: Diagnosis not present

## 2018-01-25 DIAGNOSIS — L680 Hirsutism: Secondary | ICD-10-CM | POA: Diagnosis not present

## 2018-01-25 DIAGNOSIS — Z118 Encounter for screening for other infectious and parasitic diseases: Secondary | ICD-10-CM | POA: Diagnosis not present

## 2018-01-25 DIAGNOSIS — Z1159 Encounter for screening for other viral diseases: Secondary | ICD-10-CM | POA: Diagnosis not present

## 2018-01-25 DIAGNOSIS — Z3200 Encounter for pregnancy test, result unknown: Secondary | ICD-10-CM | POA: Diagnosis not present

## 2018-01-25 DIAGNOSIS — Z6826 Body mass index (BMI) 26.0-26.9, adult: Secondary | ICD-10-CM | POA: Diagnosis not present

## 2018-02-09 DIAGNOSIS — N915 Oligomenorrhea, unspecified: Secondary | ICD-10-CM | POA: Diagnosis not present

## 2018-02-09 DIAGNOSIS — Z23 Encounter for immunization: Secondary | ICD-10-CM | POA: Diagnosis not present

## 2018-02-09 DIAGNOSIS — R7309 Other abnormal glucose: Secondary | ICD-10-CM | POA: Diagnosis not present

## 2018-02-09 DIAGNOSIS — L680 Hirsutism: Secondary | ICD-10-CM | POA: Diagnosis not present

## 2018-02-23 ENCOUNTER — Ambulatory Visit: Payer: 59 | Admitting: Gastroenterology

## 2018-02-23 ENCOUNTER — Telehealth: Payer: Self-pay | Admitting: Gastroenterology

## 2018-02-23 ENCOUNTER — Encounter: Payer: Self-pay | Admitting: Gastroenterology

## 2018-02-23 NOTE — Telephone Encounter (Signed)
Do not bill. thanks

## 2018-02-23 NOTE — Telephone Encounter (Signed)
DR Christella HartiganJacobs bill the pt?

## 2018-03-27 ENCOUNTER — Telehealth: Payer: Self-pay | Admitting: Gastroenterology

## 2018-03-27 NOTE — Telephone Encounter (Signed)
Patient called.  Has been constipated, took stool softeners and had one "red clumpy stool".  She did not think that there was blood.  I have reviewed her last colonoscopy -was negative.  Plan: Continue stool softeners.  Patient is to call us if she has any problems again.

## 2018-04-06 DIAGNOSIS — N760 Acute vaginitis: Secondary | ICD-10-CM | POA: Diagnosis not present

## 2018-04-08 DIAGNOSIS — Z23 Encounter for immunization: Secondary | ICD-10-CM | POA: Diagnosis not present

## 2018-05-05 ENCOUNTER — Encounter (INDEPENDENT_AMBULATORY_CARE_PROVIDER_SITE_OTHER): Payer: Self-pay

## 2018-05-05 ENCOUNTER — Encounter: Payer: Self-pay | Admitting: Gastroenterology

## 2018-05-05 ENCOUNTER — Ambulatory Visit: Payer: 59 | Admitting: Gastroenterology

## 2018-05-05 VITALS — BP 100/68 | HR 68 | Ht 65.5 in | Wt 163.0 lb

## 2018-05-05 DIAGNOSIS — K5900 Constipation, unspecified: Secondary | ICD-10-CM | POA: Diagnosis not present

## 2018-05-05 MED ORDER — LUBIPROSTONE 8 MCG PO CAPS: 8.0000 ug | ORAL_CAPSULE | Freq: Two times a day (BID) | ORAL | 11 refills | Status: AC

## 2018-05-05 NOTE — Progress Notes (Signed)
Review of pertinent gastrointestinal problems: 1.  Chronic constipation; colonoscopy October 2018 was completely normal. Previous trial of linzess did not help and so I tried [place], for some reason she never was able to obtain the medicine from the pharmacy.Marland Kitchen   HPI: This is a pleasant 22 year old woman whom I last saw the time of a colonoscopy 2018.  See those results summarized above  She never tried [place] because she told me that the "pharmacy never filled it" this is confusing since a phone note about a month after her colonoscopy says that the medicine was helping her.  Does take fiber and colace;  Will only go once per week.  She does not take any pain medicines narcotic or otherwise  Chief complaint is chronic constipation  ROS: complete GI ROS as described in HPI, all other review negative.  Constitutional:  No unintentional weight loss   Past Medical History:  Diagnosis Date  . Asthma   . GERD (gastroesophageal reflux disease)   . Migraines   . PCOS (polycystic ovarian syndrome)     Past Surgical History:  Procedure Laterality Date  . NO PAST SURGERIES      Current Outpatient Medications  Medication Sig Dispense Refill  . albuterol (PROVENTIL HFA;VENTOLIN HFA) 108 (90 Base) MCG/ACT inhaler Inhale 2 puffs into the lungs every 6 (six) hours as needed for wheezing or shortness of breath.    Marland Kitchen albuterol (PROVENTIL) (2.5 MG/3ML) 0.083% nebulizer solution Take 3 mLs (2.5 mg total) by nebulization every 6 (six) hours as needed for wheezing or shortness of breath. 75 mL 2  . Calcium Polycarbophil (FIBER-CAPS PO) Take 1 capsule by mouth daily.    Marland Kitchen docusate sodium (COLACE) 100 MG capsule Take 1 capsule by mouth daily.    . metFORMIN (GLUCOPHAGE-XR) 750 MG 24 hr tablet Take 750 mg by mouth at bedtime.  0  . spironolactone (ALDACTONE) 50 MG tablet Take 50 mg by mouth at bedtime.  0   No current facility-administered medications for this visit.     Allergies as of  05/05/2018  . (No Known Allergies)    Family History  Problem Relation Age of Onset  . Diabetes Maternal Grandmother   . Colon cancer Neg Hx     Social History   Socioeconomic History  . Marital status: Single    Spouse name: Not on file  . Number of children: 0  . Years of education: Not on file  . Highest education level: Not on file  Occupational History  . Occupation: medical assisant  Social Needs  . Financial resource strain: Not on file  . Food insecurity:    Worry: Not on file    Inability: Not on file  . Transportation needs:    Medical: Not on file    Non-medical: Not on file  Tobacco Use  . Smoking status: Never Smoker  . Smokeless tobacco: Never Used  Substance and Sexual Activity  . Alcohol use: No  . Drug use: No  . Sexual activity: Not on file  Lifestyle  . Physical activity:    Days per week: Not on file    Minutes per session: Not on file  . Stress: Not on file  Relationships  . Social connections:    Talks on phone: Not on file    Gets together: Not on file    Attends religious service: Not on file    Active member of club or organization: Not on file    Attends meetings of clubs  or organizations: Not on file    Relationship status: Not on file  . Intimate partner violence:    Fear of current or ex partner: Not on file    Emotionally abused: Not on file    Physically abused: Not on file    Forced sexual activity: Not on file  Other Topics Concern  . Not on file  Social History Narrative  . Not on file     Physical Exam: BP 100/68   Pulse 68   Ht 5' 5.5" (1.664 m)   Wt 163 lb (73.9 kg)   LMP 10/15/2017 (Approximate)   BMI 26.71 kg/m  Constitutional: generally well-appearing Psychiatric: alert and oriented x3 Abdomen: soft, nontender, nondistended, no obvious ascites, no peritoneal signs, normal bowel sounds No peripheral edema noted in lower extremities  Assessment and plan: 22 y.o. female with chronic constipation  I am not  sure what happened with the amitiza prescription but she tells me today that she never received it from the pharmacy, she never got a call that it was in, that was 7 months ago and she never let us know she was having difficulty with the prescription.  I still think it is worthwhile trying that medicine and so we will call in a new prescription, pill, 1 pill twice daily with food.  She will call to report on her response in 4 or 5 weeks.  Please see the "Patient Instructions" section for addition details about the plan.  Rob Bunting, MD [facility] 05/05/2018, 3:40 PM

## 2018-05-05 NOTE — Patient Instructions (Addendum)
Stay on fiber and once daily colace. Start amitiza pill, one pill twice daily with food, disp 60 with 11 refills. Call in 3-4 weeks to report on your response. Normal BMI (Body Mass Index- based on height and weight) is between 19 and 25. Your BMI today is Body mass index is 26.71 kg/m. Marland Kitchen Please consider follow up  regarding your BMI with your Primary Care Provider.

## 2018-05-11 ENCOUNTER — Telehealth: Payer: Self-pay | Admitting: Gastroenterology

## 2018-05-11 NOTE — Telephone Encounter (Signed)
Pt called to inform that her insurance does not cover Amitiza. She wants to know what other medication she could take.

## 2018-05-12 NOTE — Telephone Encounter (Signed)
Would you like to switch her to something else?  

## 2018-05-12 NOTE — Telephone Encounter (Signed)
Has she ever tried Linzess - give her samples for 145 mcg once a day -

## 2018-05-13 NOTE — Telephone Encounter (Signed)
Left message for patient to call back  

## 2018-05-14 MED ORDER — PLECANATIDE 3 MG PO TABS: 1.0000 | ORAL_TABLET | Freq: Every day | ORAL | 3 refills | Status: AC

## 2018-05-14 NOTE — Telephone Encounter (Signed)
Try Trulance once a day

## 2018-05-14 NOTE — Addendum Note (Signed)
Addended by: Junius RoadsWILLIAMS, Nilson Tabora J on: 05/14/2018 02:32 PM   Modules accepted: Orders

## 2018-05-14 NOTE — Telephone Encounter (Signed)
Patient said that she has tried Linzess and it did not work. Would you like to try something else?

## 2018-05-25 ENCOUNTER — Telehealth: Payer: Self-pay | Admitting: Gastroenterology

## 2018-05-25 MED ORDER — LACTULOSE ENCEPHALOPATHY 10 GM/15ML PO SOLN: ORAL | 3 refills | Status: AC

## 2018-05-25 NOTE — Telephone Encounter (Signed)
-----   Message from Rachael Feeaniel P Jacobs, MD sent at 05/25/2018  1:31 PM EDT ----- Yes, thanks   ----- Message ----- From: Dossie Arbouradros, Tyauna Lacaze A, LPN Sent: 4/09/81196/10/2018   1:31 PM To: Rachael Feeaniel P Jacobs, MD  Dr Christella HartiganJacobs per the pharmacist Lactulose can only be prescribed 10mg /15cc at there low dose. Is that ok to send?

## 2018-05-25 NOTE — Telephone Encounter (Signed)
-----   Message from Rachael Feeaniel P Jacobs, MD sent at 05/23/2018  6:36 AM EDT ----- OK.  Let's try lactulose:  Warn her this may cause a bit of bloating; will start low dose 10cc twice daily, disp 1 month with 3 refills.  Thanks   ----- Message ----- From: Dossie Arbouradros, Anneth Brunell A, LPN Sent: 8/2/95626/06/2018   2:58 PM To: Rachael Feeaniel P Jacobs, MD  Dr Christella HartiganJacobs, patients health plan criteria for trulance was denied. She has tried Linzess, but that did not work. Do you have any other suggestions?  Please advise.

## 2018-05-25 NOTE — Telephone Encounter (Signed)
Patient unable to afford Trulance Lactulose sent into pharmacy per dr Christella HartiganJacobs orders. Patient notified.

## 2018-06-19 DIAGNOSIS — L739 Follicular disorder, unspecified: Secondary | ICD-10-CM | POA: Diagnosis not present

## 2018-08-03 ENCOUNTER — Encounter (HOSPITAL_COMMUNITY): Payer: Self-pay | Admitting: Emergency Medicine

## 2018-08-03 ENCOUNTER — Emergency Department (HOSPITAL_COMMUNITY): Payer: No Typology Code available for payment source

## 2018-08-03 ENCOUNTER — Emergency Department (HOSPITAL_COMMUNITY)
Admission: EM | Admit: 2018-08-03 | Discharge: 2018-08-03 | Disposition: A | Discharge status: Home / Self Care | Payer: No Typology Code available for payment source | Attending: Emergency Medicine | Admitting: Emergency Medicine

## 2018-08-03 DIAGNOSIS — S3992XA Unspecified injury of lower back, initial encounter: Secondary | ICD-10-CM | POA: Diagnosis present

## 2018-08-03 DIAGNOSIS — Z79899 Other long term (current) drug therapy: Secondary | ICD-10-CM | POA: Diagnosis not present

## 2018-08-03 DIAGNOSIS — Z7984 Long term (current) use of oral hypoglycemic drugs: Secondary | ICD-10-CM | POA: Diagnosis not present

## 2018-08-03 DIAGNOSIS — M545 Low back pain: Secondary | ICD-10-CM | POA: Diagnosis not present

## 2018-08-03 DIAGNOSIS — Y9389 Activity, other specified: Secondary | ICD-10-CM | POA: Diagnosis not present

## 2018-08-03 DIAGNOSIS — S39012A Strain of muscle, fascia and tendon of lower back, initial encounter: Secondary | ICD-10-CM | POA: Diagnosis not present

## 2018-08-03 DIAGNOSIS — Y92410 Unspecified street and highway as the place of occurrence of the external cause: Secondary | ICD-10-CM | POA: Insufficient documentation

## 2018-08-03 DIAGNOSIS — M546 Pain in thoracic spine: Secondary | ICD-10-CM | POA: Diagnosis not present

## 2018-08-03 DIAGNOSIS — J45909 Unspecified asthma, uncomplicated: Secondary | ICD-10-CM | POA: Insufficient documentation

## 2018-08-03 DIAGNOSIS — Y998 Other external cause status: Secondary | ICD-10-CM | POA: Diagnosis not present

## 2018-08-03 LAB — I-STAT BETA HCG BLOOD, ED (MC, WL, AP ONLY): I-stat hCG, quantitative: <5 m[IU]/mL (ref <5)

## 2018-08-03 MED ORDER — METHOCARBAMOL 500 MG PO TABS: 500.0000 mg | ORAL_TABLET | Freq: Two times a day (BID) | ORAL | 0 refills | Status: AC

## 2018-08-03 NOTE — ED Notes (Signed)
Patient transported to X-ray 
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
## 2018-08-06 DIAGNOSIS — E282 Polycystic ovarian syndrome: Secondary | ICD-10-CM | POA: Diagnosis not present

## 2018-08-06 DIAGNOSIS — Z1159 Encounter for screening for other viral diseases: Secondary | ICD-10-CM | POA: Diagnosis not present

## 2018-08-06 DIAGNOSIS — N644 Mastodynia: Secondary | ICD-10-CM | POA: Diagnosis not present

## 2018-08-06 DIAGNOSIS — N915 Oligomenorrhea, unspecified: Secondary | ICD-10-CM | POA: Diagnosis not present

## 2018-08-20 DIAGNOSIS — L680 Hirsutism: Secondary | ICD-10-CM | POA: Diagnosis not present

## 2018-08-20 DIAGNOSIS — E282 Polycystic ovarian syndrome: Secondary | ICD-10-CM | POA: Diagnosis not present

## 2018-09-01 DIAGNOSIS — R0981 Nasal congestion: Secondary | ICD-10-CM | POA: Diagnosis not present

## 2018-09-01 DIAGNOSIS — Z23 Encounter for immunization: Secondary | ICD-10-CM | POA: Diagnosis not present

## 2018-09-06 DIAGNOSIS — J45909 Unspecified asthma, uncomplicated: Secondary | ICD-10-CM | POA: Diagnosis not present

## 2018-09-06 DIAGNOSIS — R05 Cough: Secondary | ICD-10-CM | POA: Diagnosis not present

## 2018-09-06 DIAGNOSIS — J309 Allergic rhinitis, unspecified: Secondary | ICD-10-CM | POA: Diagnosis not present

## 2018-11-03 DIAGNOSIS — H18623 Keratoconus, unstable, bilateral: Secondary | ICD-10-CM | POA: Diagnosis not present

## 2018-11-17 ENCOUNTER — Telehealth: Payer: Self-pay | Admitting: Gastroenterology

## 2018-11-17 NOTE — Telephone Encounter (Signed)
Oncall Note: Patient called with c/o heartburn, she took 20mg  pepcid. No nausea, vomiting,abd pain, dysphagia or odynophagia. Advised her to take additional 20mg  pepcid. Mylanta as needed. Patient to call in the AM to schedule follow up appointment for better symptom control.

## 2018-11-24 ENCOUNTER — Other Ambulatory Visit: Payer: Self-pay | Admitting: Family Medicine

## 2018-11-24 DIAGNOSIS — J329 Chronic sinusitis, unspecified: Secondary | ICD-10-CM

## 2018-12-03 ENCOUNTER — Ambulatory Visit
Admission: RE | Admit: 2018-12-03 | Discharge: 2018-12-03 | Disposition: A | Discharge status: Home / Self Care | Payer: 59 | Source: Ambulatory Visit | Attending: Family Medicine | Admitting: Family Medicine

## 2018-12-03 DIAGNOSIS — J329 Chronic sinusitis, unspecified: Secondary | ICD-10-CM

## 2018-12-03 DIAGNOSIS — J341 Cyst and mucocele of nose and nasal sinus: Secondary | ICD-10-CM | POA: Diagnosis not present

## 2018-12-20 DIAGNOSIS — J309 Allergic rhinitis, unspecified: Secondary | ICD-10-CM | POA: Diagnosis not present

## 2018-12-20 DIAGNOSIS — R0981 Nasal congestion: Secondary | ICD-10-CM | POA: Diagnosis not present

## 2018-12-20 DIAGNOSIS — N94819 Vulvodynia, unspecified: Secondary | ICD-10-CM | POA: Diagnosis not present

## 2018-12-20 DIAGNOSIS — E282 Polycystic ovarian syndrome: Secondary | ICD-10-CM | POA: Diagnosis not present

## 2018-12-20 DIAGNOSIS — J342 Deviated nasal septum: Secondary | ICD-10-CM | POA: Diagnosis not present

## 2018-12-22 DIAGNOSIS — R070 Pain in throat: Secondary | ICD-10-CM | POA: Diagnosis not present

## 2018-12-22 DIAGNOSIS — H6983 Other specified disorders of Eustachian tube, bilateral: Secondary | ICD-10-CM | POA: Diagnosis not present

## 2018-12-23 DIAGNOSIS — S39012A Strain of muscle, fascia and tendon of lower back, initial encounter: Secondary | ICD-10-CM | POA: Diagnosis not present

## 2018-12-23 DIAGNOSIS — R202 Paresthesia of skin: Secondary | ICD-10-CM | POA: Diagnosis not present

## 2018-12-23 DIAGNOSIS — S7002XA Contusion of left hip, initial encounter: Secondary | ICD-10-CM | POA: Diagnosis not present

## 2018-12-23 DIAGNOSIS — S20212A Contusion of left front wall of thorax, initial encounter: Secondary | ICD-10-CM | POA: Diagnosis not present

## 2018-12-23 DIAGNOSIS — R52 Pain, unspecified: Secondary | ICD-10-CM | POA: Diagnosis not present

## 2019-01-11 DIAGNOSIS — E282 Polycystic ovarian syndrome: Secondary | ICD-10-CM | POA: Diagnosis not present

## 2019-01-11 DIAGNOSIS — N970 Female infertility associated with anovulation: Secondary | ICD-10-CM | POA: Diagnosis not present

## 2019-01-27 ENCOUNTER — Ambulatory Visit
Admission: EM | Admit: 2019-01-27 | Discharge: 2019-01-27 | Disposition: A | Discharge status: Home / Self Care | Payer: 59 | Attending: Family Medicine | Admitting: Family Medicine

## 2019-01-27 ENCOUNTER — Encounter: Payer: Self-pay | Admitting: Emergency Medicine

## 2019-01-27 ENCOUNTER — Ambulatory Visit: Payer: 59

## 2019-01-27 ENCOUNTER — Ambulatory Visit (INDEPENDENT_AMBULATORY_CARE_PROVIDER_SITE_OTHER): Payer: 59

## 2019-01-27 DIAGNOSIS — R059 Cough, unspecified: Secondary | ICD-10-CM

## 2019-01-27 DIAGNOSIS — R05 Cough: Secondary | ICD-10-CM

## 2019-01-27 MED ORDER — BENZONATATE 200 MG PO CAPS: 200.0000 mg | ORAL_CAPSULE | Freq: Three times a day (TID) | ORAL | 0 refills | Status: AC | PRN

## 2019-01-27 NOTE — Discharge Instructions (Signed)
Chest xray negative Cough likely viral or related to increased drainage Please continue allegra-D Tessalon every 8 hours for cough May add in mucinex

## 2019-01-27 NOTE — ED Provider Notes (Signed)
[facility]    CSN: [id number] Arrival date & time: 01/27/19  1900     History   Chief Complaint Chief Complaint  Patient presents with  . Cough    HPI Tammy French is a 23 y.o. female history of asthma, PCOS presenting today for evaluation of a cough.  Patient states that she has had a cough for the past 3 days, but notes that approximately 1 year ago she had a similar cough for approximately 1 month.  She notes that if she feels there is something in her throat/chest that needs to be cleared which then stimulates a cough.  She notes that she has chronic sinusitis and often has nasal congestion.  She takes Allegra-D daily for this.  She denies any worsening of the symptoms, fevers.  She does not feel like her asthma has worsened, denies chest tightness, shortness of breath and wheezing.  She feels like her symptoms are worse when she is at work.  Denies history of previous DVT/PE, denies leg pain or leg swelling, denies recent travel or immobilization, denies exogenous estrogen use, denies smoking history.  HPI  Past Medical History:  Diagnosis Date  . Asthma   . GERD (gastroesophageal reflux disease)   . Migraines   . PCOS (polycystic ovarian syndrome)     There are no active problems to display for this patient.   Past Surgical History:  Procedure Laterality Date  . NO PAST SURGERIES      OB History   No obstetric history on file.      Home Medications    Prior to Admission medications   Medication Sig Start Date End Date Taking? Authorizing Provider  albuterol (PROVENTIL HFA;VENTOLIN HFA) 108 (90 Base) MCG/ACT inhaler Inhale 2 puffs into the lungs every 6 (six) hours as needed for wheezing or shortness of breath.   Yes [provider]  albuterol (PROVENTIL) (2.5 MG/3ML) 0.083% nebulizer solution Take 3 mLs (2.5 mg total) by nebulization every 6 (six) hours as needed for wheezing or shortness of breath. 01/24/17   Janne NapoleonNeese, Hope M, NP    benzonatate (TESSALON) 200 MG capsule Take 1 capsule (200 mg total) by mouth 3 (three) times daily as needed for up to 7 days for cough. 01/27/19 02/03/19  Sadonna Kotara C, PA-C  Calcium Polycarbophil (FIBER-CAPS PO) Take 1 capsule by mouth daily.    [provider]  docusate sodium (COLACE) 100 MG capsule Take 1 capsule by mouth daily. 09/14/16   [provider]  lactulose, encephalopathy, (GENERLAC) 10 GM/15ML SOLN Take 15ml twice daily 05/25/18   Rachael FeeJacobs, Daniel P, MD  lubiprostone (AMITIZA) 8 MCG capsule Take 1 capsule (8 mcg total) by mouth 2 (two) times daily with a meal. 05/05/18   Rachael FeeJacobs, Daniel P, MD  metFORMIN (GLUCOPHAGE-XR) 750 MG 24 hr tablet Take 750 mg by mouth at bedtime. 02/09/18   [provider]  methocarbamol (ROBAXIN) 500 MG tablet Take 1 tablet (500 mg total) by mouth 2 (two) times daily. 08/03/18   Kellie ShropshireShrosbree, Emily J, PA-C  Plecanatide (TRULANCE) 3 MG TABS Take 1 tablet by mouth daily. 05/14/18   Lynann BolognaGupta, Rajesh, MD  spironolactone (ALDACTONE) 50 MG tablet Take 50 mg by mouth at bedtime. 02/09/18   [provider]    Family History Family History  Problem Relation Age of Onset  . Diabetes Maternal Grandmother   . Colon cancer Neg Hx     Social History Social History   Tobacco Use  . Smoking status:  Never Smoker  . Smokeless tobacco: Never Used  Substance Use Topics  . Alcohol use: No  . Drug use: No     Allergies   Patient has no known allergies.   Review of Systems Review of Systems  Constitutional: Negative for activity change, appetite change, chills, fatigue and fever.  HENT: Positive for congestion, rhinorrhea and sinus pressure. Negative for ear pain, sore throat and trouble swallowing.   Eyes: Negative for discharge and redness.  Respiratory: Positive for cough. Negative for chest tightness and shortness of breath.   Cardiovascular: Negative for chest pain.  Gastrointestinal: Negative for abdominal pain, diarrhea,  nausea and vomiting.  Musculoskeletal: Negative for myalgias.  Skin: Negative for rash.  Neurological: Negative for dizziness, light-headedness and headaches.     Physical Exam Triage Vital Signs ED Triage Vitals  Enc Vitals Group     BP 01/27/19 1908 (!) 146/91     Pulse Rate 01/27/19 1908 82     Resp 01/27/19 1908 18     Temp 01/27/19 1908 98.1 F (36.7 C)     Temp Source 01/27/19 1908 Oral     SpO2 01/27/19 1908 98 %     Weight --      Height --      Head Circumference --      Peak Flow --      Pain Score 01/27/19 1909 7     Pain Loc --      Pain Edu? --      Excl. in GC? --    No data found.  Updated Vital Signs BP (!) 146/91   Pulse 82   Temp 98.1 F (36.7 C) (Oral)   Resp 18   LMP 10/08/2018   SpO2 98%   Visual Acuity Right Eye Distance:   Left Eye Distance:   Bilateral Distance:    Right Eye Near:   Left Eye Near:    Bilateral Near:     Physical Exam Vitals signs and nursing note reviewed.  Constitutional:      General: She is not in acute distress.    Appearance: She is well-developed.  HENT:     Head: Normocephalic and atraumatic.     Ears:     Comments: Bilateral ears without tenderness to palpation of external auricle, tragus and mastoid, EAC's without erythema or swelling, TM's with good bony landmarks and cone of light. Non erythematous.     Nose:     Comments: Nasal mucosa appeared erythematous, swollen and slightly irritated    Mouth/Throat:     Comments: Oral mucosa pink and moist, no tonsillar enlargement or exudate. Posterior pharynx patent and nonerythematous, no uvula deviation or swelling. Normal phonation. Eyes:     Conjunctiva/sclera: Conjunctivae normal.  Neck:     Musculoskeletal: Neck supple.  Cardiovascular:     Rate and Rhythm: Normal rate and regular rhythm.     Heart sounds: No murmur.  Pulmonary:     Effort: Pulmonary effort is normal. No respiratory distress.     Breath sounds: Normal breath sounds.     Comments:  Occasional clearing of throat followed by cough  Breathing comfortably at rest, CTABL, no wheezing, rales or other adventitious sounds auscultated Abdominal:     Palpations: Abdomen is soft.     Tenderness: There is no abdominal tenderness.  Skin:    General: Skin is warm and dry.  Neurological:     Mental Status: She is alert.      UC Treatments /  Results  Labs (all labs ordered are listed, but only abnormal results are displayed) Labs Reviewed - No data to display  EKG None  Radiology Dg Chest 2 View  Result Date: 01/27/2019 CLINICAL DATA:  Productive cough EXAM: CHEST - 2 VIEW COMPARISON:  None. FINDINGS: Heart and mediastinal contours are within normal limits. No focal opacities or effusions. No acute bony abnormality. IMPRESSION: No active cardiopulmonary disease. Electronically Signed   By: Charlett Nose M.D.   On: 01/27/2019 19:37    Procedures Procedures (including critical care time)  Medications Ordered in UC Medications - No data to display  Initial Impression / Assessment and Plan / UC Course  I have reviewed the triage vital signs and the nursing notes.  Pertinent labs & imaging results that were available during my care of the patient were reviewed by me and considered in my medical decision making (see chart for details).     Chest x-ray negative, cough less likely viral or related to increase in drainage.  Cough seems to be more of a clearing of throat versus a true cough.  Will continue symptomatic and supportive care.  Continue Allegra-D, may add in Mucinex to help with any increase in drainage.  Tessalon as needed for cough.  Also recommend over-the-counter Delsym or Robitussin for cough.  Push fluids.Discussed strict return precautions. Patient verbalized understanding and is agreeable with plan.  Final Clinical Impressions(s) / UC Diagnoses   Final diagnoses:  Cough     Discharge Instructions     Chest xray negative Cough likely viral or  related to increased drainage Please continue allegra-D Tessalon every 8 hours for cough May add in mucinex    ED Prescriptions    Medication Sig Dispense Auth. Provider   benzonatate (TESSALON) 200 MG capsule Take 1 capsule (200 mg total) by mouth 3 (three) times daily as needed for up to 7 days for cough. 28 capsule Pura Picinich C, PA-C     Controlled Substance Prescriptions [place] Controlled Substance Registry consulted? Not Applicable   Lew Dawes, [place] 01/27/19 1946

## 2019-01-27 NOTE — ED Triage Notes (Signed)
Pt presents to [facility] for persistent cough x 1 month with some clear production.

## 2019-03-08 DIAGNOSIS — E282 Polycystic ovarian syndrome: Secondary | ICD-10-CM | POA: Diagnosis not present

## 2019-05-06 DIAGNOSIS — Z3141 Encounter for fertility testing: Secondary | ICD-10-CM | POA: Diagnosis not present

## 2019-05-06 DIAGNOSIS — E282 Polycystic ovarian syndrome: Secondary | ICD-10-CM | POA: Diagnosis not present

## 2019-06-16 ENCOUNTER — Other Ambulatory Visit: Payer: Self-pay | Admitting: Gastroenterology

## 2019-08-02 ENCOUNTER — Ambulatory Visit: Payer: 59 | Admitting: Physician Assistant

## 2019-10-02 ENCOUNTER — Other Ambulatory Visit: Payer: Self-pay

## 2019-10-02 ENCOUNTER — Encounter: Payer: Self-pay | Admitting: Emergency Medicine

## 2019-10-02 ENCOUNTER — Emergency Department (INDEPENDENT_AMBULATORY_CARE_PROVIDER_SITE_OTHER)
Admission: EM | Admit: 2019-10-02 | Discharge: 2019-10-02 | Disposition: A | Discharge status: Home / Self Care | Payer: 59 | Source: Home / Self Care | Attending: Family Medicine | Admitting: Family Medicine

## 2019-10-02 DIAGNOSIS — R519 Headache, unspecified: Secondary | ICD-10-CM | POA: Diagnosis not present

## 2019-10-02 MED ORDER — PREDNISONE 50 MG PO TABS: 50.0000 mg | ORAL_TABLET | Freq: Every day | ORAL | 0 refills | Status: AC

## 2019-10-02 MED ORDER — LEVOCETIRIZINE DIHYDROCHLORIDE 5 MG PO TABS: 5.0000 mg | ORAL_TABLET | Freq: Every evening | ORAL | 1 refills | Status: AC

## 2019-10-02 MED ORDER — IPRATROPIUM BROMIDE 0.06 % NA SOLN: 2.0000 | Freq: Four times a day (QID) | NASAL | 1 refills | Status: AC

## 2019-10-02 NOTE — Discharge Instructions (Addendum)
°  Please use resource guide to schedule an appointment with an ear nose and throat specialist for a second opinion about recurrent sinus headaches.  You may benefit from sinus surgery if conservative treatments such as allergy medications and sinus rinses are not helping.  You may also want to schedule an appointment with a headache specialist for further evaluation of your recurrent headaches.

## 2019-10-02 NOTE — ED Triage Notes (Signed)
Patient c/o sinus headache and pressure for over 3 months, seeing an ENT without much relief.  Patient states that on the right side of her nostril there's possibly a polyp that's causing headaches.

## 2019-10-02 NOTE — ED Provider Notes (Signed)
[facility]    CSN: [id number] Arrival date & time: 10/02/19  0945      History   Chief Complaint Chief Complaint  Patient presents with  . Facial Pain    HPI Esti Demello is a 23 y.o. female.   HPI  Morgyn Marut is a 23 y.o. female presenting to UC with c/o recurrent frontal sinus headaches for over 1 year, worse over the last 3 months. She reports seeing an ENT last year, had a scope down her nose and advised there was a little inflammation but no surgery was suggested.  She has since tried various OTC allergy pills and nasal sprays w/o relief.  She had a PA from work look in her nose the other day who advised pt may have a polyp in her Right nostril.  She has tried [facility] daily w/o relief. She has tried to get into see her ENT but due to Covid-19 restrictions they are only seeing patients in person from 8AM-12PM.  Due to patient's work schedule she has not been able to schedule an inperson or even an E-visit appointment.   Denies hx of migraines. Denies fever, chills, cough, congestion, n/v/d. No change in vision or dizziness.   Past Medical History:  Diagnosis Date  . Asthma   . GERD (gastroesophageal reflux disease)   . Migraines   . PCOS (polycystic ovarian syndrome)     There are no active problems to display for this patient.   Past Surgical History:  Procedure Laterality Date  . NO PAST SURGERIES      OB History   No obstetric history on file.      Home Medications    Prior to Admission medications   Medication Sig Start Date End Date Taking? Authorizing Provider  albuterol (PROVENTIL HFA;VENTOLIN HFA) 108 (90 Base) MCG/ACT inhaler Inhale 2 puffs into the lungs every 6 (six) hours as needed for wheezing or shortness of breath.   Yes [provider]  albuterol (PROVENTIL) (2.5 MG/3ML) 0.083% nebulizer solution Take 3 mLs (2.5 mg total) by nebulization every 6 (six) hours as needed for wheezing or shortness of breath. 01/24/17  Yes  Neese, [place], NP  docusate sodium (COLACE) 100 MG capsule Take 1 capsule by mouth daily. 09/14/16  Yes [provider]  Fexofenadine-Pseudoephedrine (ALLEGRA-D PO) Take by mouth.   Yes [provider]  fluticasone (FLONASE) 50 MCG/ACT nasal spray Place into both nostrils daily.   Yes [provider]  metFORMIN (GLUCOPHAGE-XR) 750 MG 24 hr tablet Take 750 mg by mouth at bedtime. 02/09/18  Yes [provider]  montelukast (SINGULAIR) 10 MG tablet Take 10 mg by mouth daily. 09/19/19  Yes [provider]  Multiple Vitamin (MULTIVITAMIN) tablet Take 1 tablet by mouth daily.   Yes [provider]  Calcium Polycarbophil (FIBER-CAPS PO) Take 1 capsule by mouth daily.    [provider]  ipratropium (ATROVENT) 0.06 % nasal spray Place 2 sprays into both nostrils 4 (four) times daily. 10/02/19   Noe Gens, PA-C  lactulose, encephalopathy, (GENERLAC) 10 GM/15ML SOLN Take 83ml twice daily 05/25/18   Milus Banister, MD  levocetirizine (XYZAL) 5 MG tablet Take 1 tablet (5 mg total) by mouth every evening. 10/02/19   Noe Gens, PA-C  lubiprostone (AMITIZA) 8 MCG capsule Take 1 capsule (8 mcg total) by mouth 2 (two) times daily with a meal. 05/05/18   Milus Banister, MD  methocarbamol (ROBAXIN) 500 MG tablet Take 1 tablet (  500 mg total) by mouth 2 (two) times daily. 08/03/18   Kellie ShropshireShrosbree, Emily J, PA-C  Plecanatide (TRULANCE) 3 MG TABS Take 1 tablet by mouth daily. 05/14/18   Lynann BolognaGupta, Rajesh, MD  predniSONE (DELTASONE) 50 MG tablet Take 1 tablet (50 mg total) by mouth daily with breakfast for 5 days. 10/02/19 10/07/19  Lurene ShadowPhelps, Krisha Beegle O, PA-C  spironolactone (ALDACTONE) 50 MG tablet Take 50 mg by mouth at bedtime. 02/09/18   [provider]    Family History Family History  Problem Relation Age of Onset  . Diabetes Maternal Grandmother   . Colon cancer Neg Hx     Social History Social History   Tobacco Use  . Smoking status: Never  Smoker  . Smokeless tobacco: Never Used  Substance Use Topics  . Alcohol use: No  . Drug use: No     Allergies   Patient has no known allergies.   Review of Systems Review of Systems  Constitutional: Negative for chills and fever.  HENT: Positive for congestion, sinus pressure and sinus pain. Negative for postnasal drip, rhinorrhea, sore throat and tinnitus.   Eyes: Negative for photophobia and visual disturbance.  Respiratory: Negative for cough.   Gastrointestinal: Negative for nausea and vomiting.  Neurological: Positive for headaches. Negative for dizziness and light-headedness.     Physical Exam Triage Vital Signs ED Triage Vitals [10/02/19 1000]  Enc Vitals Group     BP (!) 142/79     Pulse Rate (!) 102     Resp      Temp 98.8 F (37.1 C)     Temp Source Oral     SpO2 99 %     Weight 177 lb (80.3 kg)     Height 5\' 6"  (1.676 m)     Head Circumference      Peak Flow      Pain Score 5     Pain Loc      Pain Edu?      Excl. in GC?    No data found.  Updated Vital Signs BP (!) 142/79 (BP Location: Right Arm)   Pulse (!) 102   Temp 98.8 F (37.1 C) (Oral)   Ht 5\' 6"  (1.676 m)   Wt 177 lb (80.3 kg)   LMP 09/14/2019   SpO2 99%   BMI 28.57 kg/m   Visual Acuity Right Eye Distance:   Left Eye Distance:   Bilateral Distance:    Right Eye Near:   Left Eye Near:    Bilateral Near:     Physical Exam Vitals signs and nursing note reviewed.  Constitutional:      Appearance: Normal appearance. She is well-developed.  HENT:     Head: Normocephalic and atraumatic.     Right Ear: Tympanic membrane normal.     Left Ear: Tympanic membrane normal.     Nose: Mucosal edema ( worse in Right nostril) present.     Right Sinus: Maxillary sinus tenderness present. No frontal sinus tenderness.     Left Sinus: No maxillary sinus tenderness or frontal sinus tenderness.     Mouth/Throat:     Lips: Pink.     Mouth: Mucous membranes are moist.     Pharynx: Oropharynx  is clear. Uvula midline.  Eyes:     Extraocular Movements: Extraocular movements intact.     Conjunctiva/sclera: Conjunctivae normal.     Pupils: Pupils are equal, round, and reactive to light.  Neck:     Musculoskeletal: Normal range of motion  and neck supple. No neck rigidity.  Cardiovascular:     Rate and Rhythm: Normal rate and regular rhythm.  Pulmonary:     Effort: Pulmonary effort is normal. No respiratory distress.     Breath sounds: Normal breath sounds.  Musculoskeletal: Normal range of motion.  Skin:    General: Skin is warm and dry.     Capillary Refill: Capillary refill takes less than 2 seconds.  Neurological:     General: No focal deficit present.     Mental Status: She is alert and oriented to person, place, and time.     Cranial Nerves: No cranial nerve deficit.     Sensory: No sensory deficit.     Motor: No weakness.     Coordination: Coordination normal.     Gait: Gait normal.  Psychiatric:        Mood and Affect: Mood normal.        Behavior: Behavior normal.      UC Treatments / Results  Labs (all labs ordered are listed, but only abnormal results are displayed) Labs Reviewed - No data to display  EKG   Radiology No results found.  Procedures Procedures (including critical care time)  Medications Ordered in UC Medications - No data to display  Initial Impression / Assessment and Plan / UC Course  I have reviewed the triage vital signs and the nursing notes.  Pertinent labs & imaging results that were available during my care of the patient were reviewed by me and considered in my medical decision making (see chart for details).     No evidence of bacterial infection at this time Normal neuro exam Question recurrent sinus headaches vs migraines Will have pt try trial of ipratropium nasal spray, oral prednisone for 5 days and Xyzal Encouraged f/u with ENT and headache specialist.  AVS provided  Final Clinical Impressions(s) / UC  Diagnoses   Final diagnoses:  Sinus headache  Recurrent headache     Discharge Instructions      Please use resource guide to schedule an appointment with an ear nose and throat specialist for a second opinion about recurrent sinus headaches.  You may benefit from sinus surgery if conservative treatments such as allergy medications and sinus rinses are not helping.  You may also want to schedule an appointment with a headache specialist for further evaluation of your recurrent headaches.     ED Prescriptions    Medication Sig Dispense Auth. Provider   ipratropium (ATROVENT) 0.06 % nasal spray Place 2 sprays into both nostrils 4 (four) times daily. 15 mL Doroteo Glassman, Amol Domanski O, PA-C   predniSONE (DELTASONE) 50 MG tablet Take 1 tablet (50 mg total) by mouth daily with breakfast for 5 days. 5 tablet Doroteo Glassman, Keyosha Tiedt O, PA-C   levocetirizine (XYZAL) 5 MG tablet Take 1 tablet (5 mg total) by mouth every evening. 30 tablet Lurene Shadow, [place]     PDMP not reviewed this encounter.   Lurene Shadow, PA-C 10/02/19 1047

## 2019-10-17 IMAGING — DX DG CHEST 2V
2 series · 2 of 2 positions shown · non-contrast
Comparison: None.

CLINICAL DATA: Productive cough

EXAM:
CHEST - 2 VIEW

[chest pa]
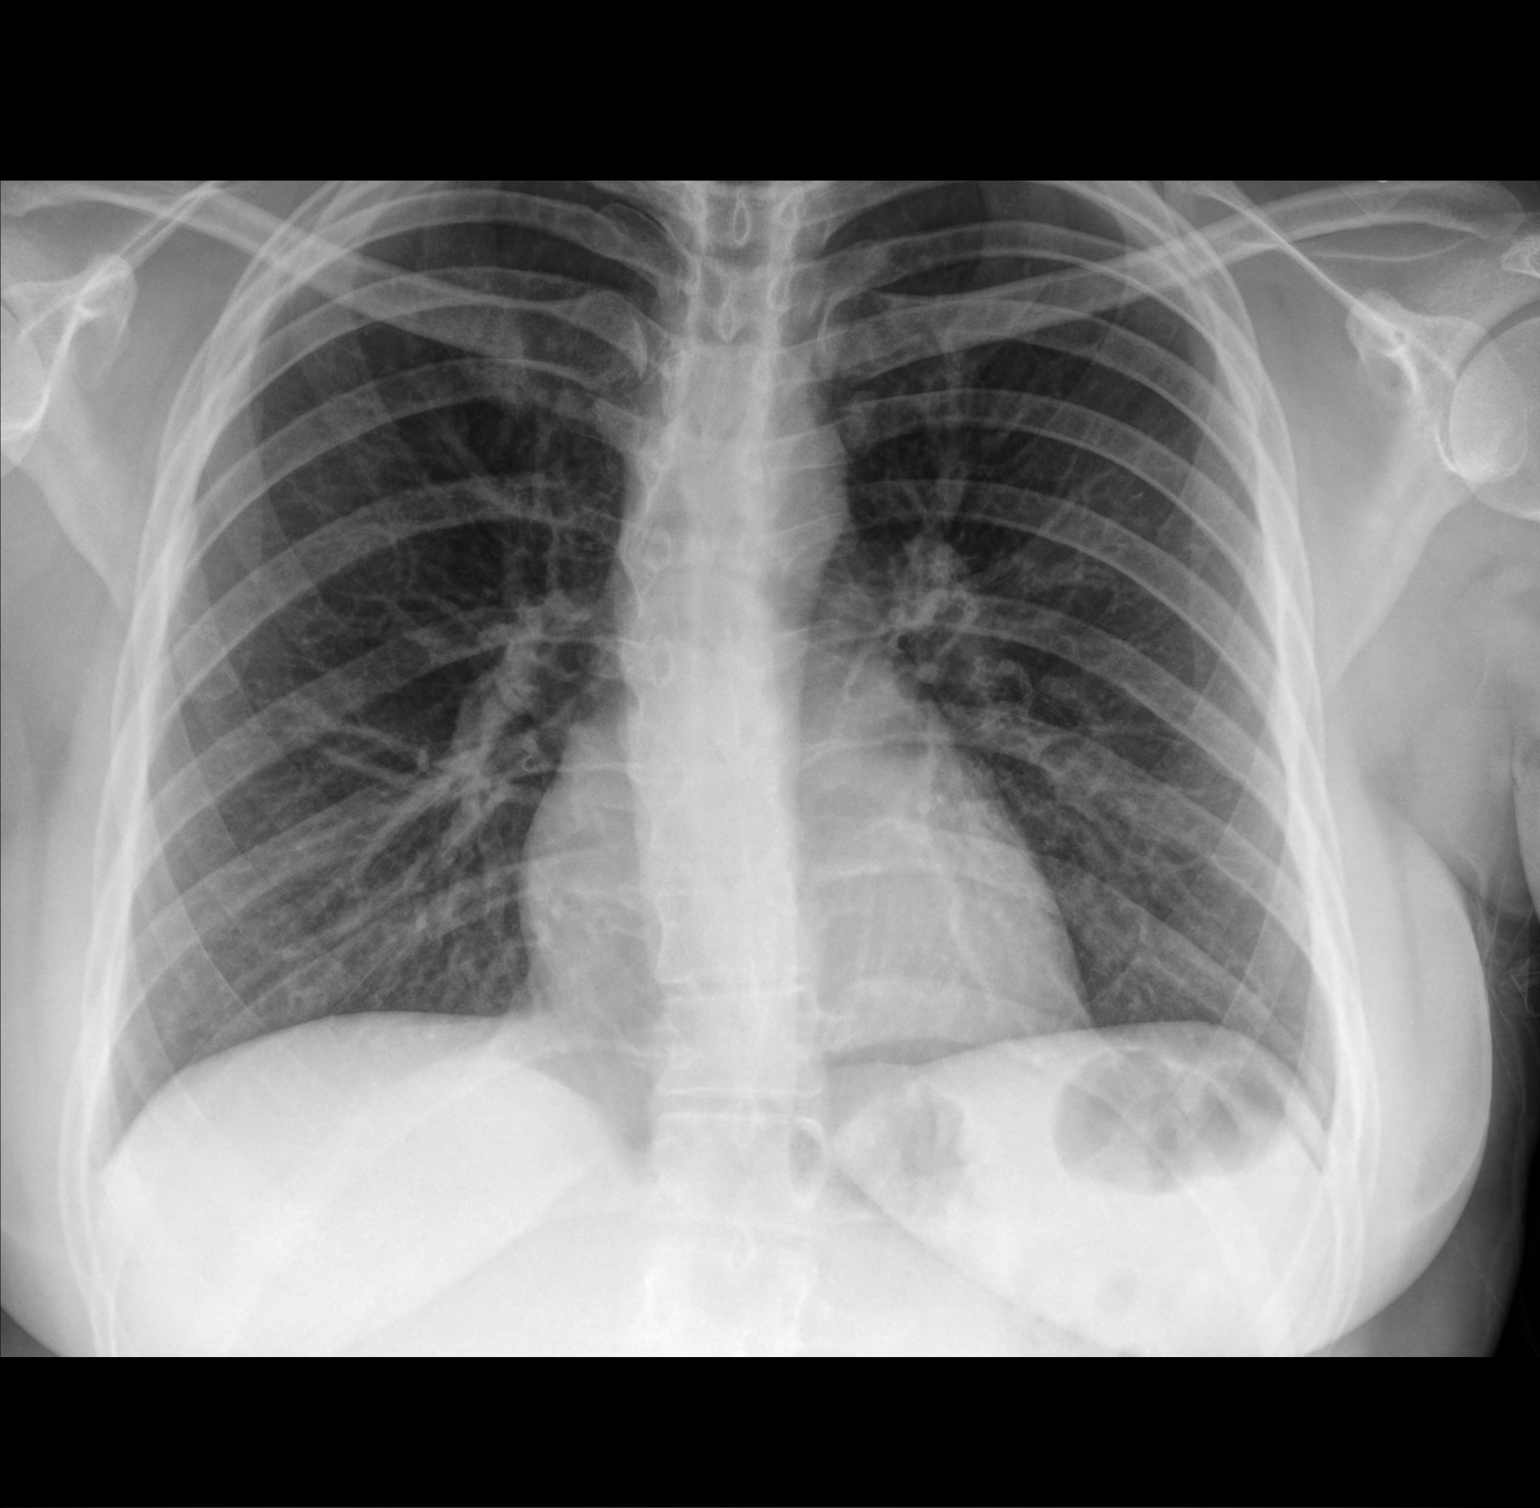

[chest lat]
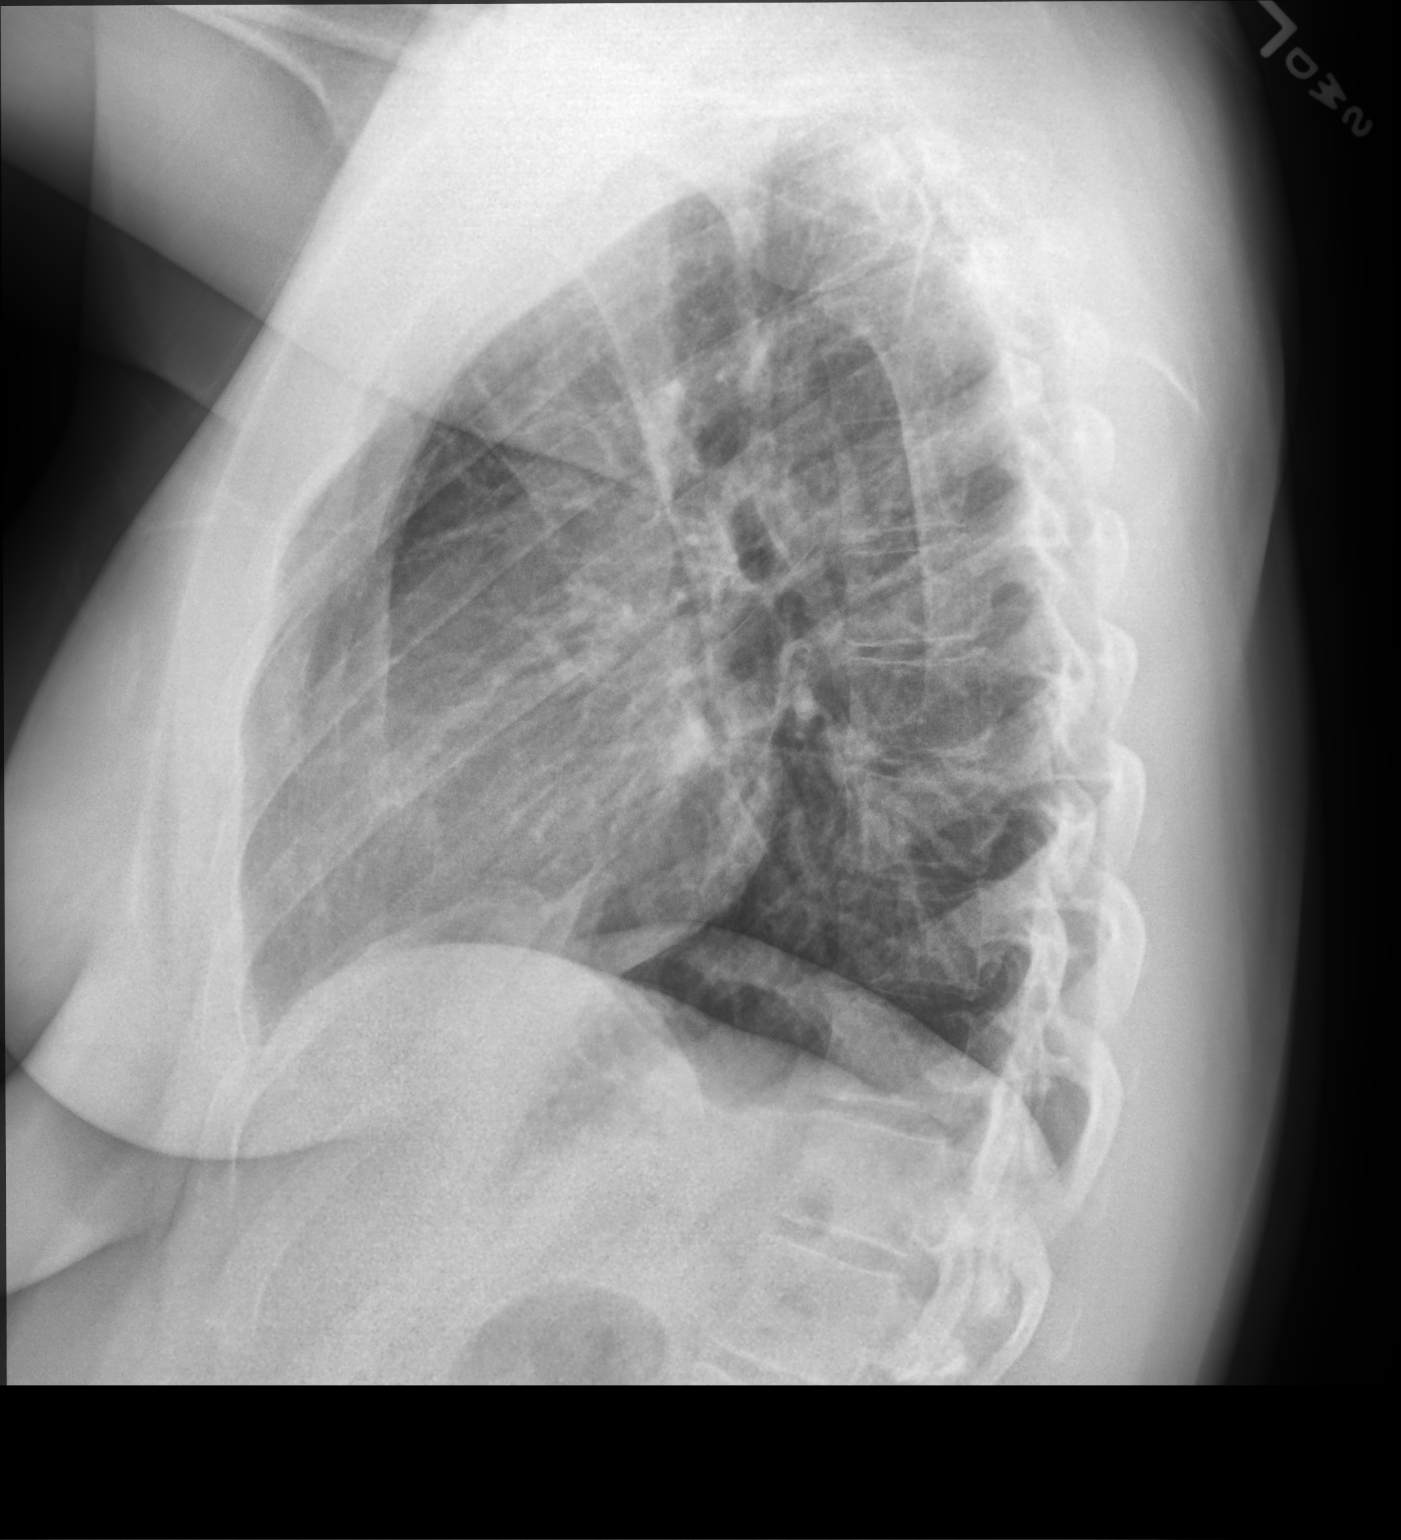

[2 of 2 positions shown; findings below may reference images not displayed]

FINDINGS: Heart and mediastinal contours are within normal limits. No focal
opacities or effusions. No acute bony abnormality.
IMPRESSION: No active cardiopulmonary disease.

## 2020-02-23 ENCOUNTER — Other Ambulatory Visit: Payer: Self-pay

## 2020-02-23 ENCOUNTER — Emergency Department (HOSPITAL_COMMUNITY): Payer: 59

## 2020-02-23 ENCOUNTER — Emergency Department (HOSPITAL_COMMUNITY)
Admission: EM | Admit: 2020-02-23 | Discharge: 2020-02-23 | Disposition: A | Discharge status: Home / Self Care | Payer: 59 | Attending: Emergency Medicine | Admitting: Emergency Medicine

## 2020-02-23 ENCOUNTER — Encounter (HOSPITAL_COMMUNITY): Payer: Self-pay

## 2020-02-23 DIAGNOSIS — Y92410 Unspecified street and highway as the place of occurrence of the external cause: Secondary | ICD-10-CM | POA: Diagnosis not present

## 2020-02-23 DIAGNOSIS — S80211A Abrasion, right knee, initial encounter: Secondary | ICD-10-CM | POA: Insufficient documentation

## 2020-02-23 DIAGNOSIS — J45909 Unspecified asthma, uncomplicated: Secondary | ICD-10-CM | POA: Diagnosis not present

## 2020-02-23 DIAGNOSIS — Z7984 Long term (current) use of oral hypoglycemic drugs: Secondary | ICD-10-CM | POA: Diagnosis not present

## 2020-02-23 DIAGNOSIS — Y999 Unspecified external cause status: Secondary | ICD-10-CM | POA: Diagnosis not present

## 2020-02-23 DIAGNOSIS — R519 Headache, unspecified: Secondary | ICD-10-CM | POA: Diagnosis not present

## 2020-02-23 DIAGNOSIS — Y939 Activity, unspecified: Secondary | ICD-10-CM | POA: Diagnosis not present

## 2020-02-23 DIAGNOSIS — Z79899 Other long term (current) drug therapy: Secondary | ICD-10-CM | POA: Insufficient documentation

## 2020-02-23 DIAGNOSIS — S8991XA Unspecified injury of right lower leg, initial encounter: Secondary | ICD-10-CM | POA: Diagnosis present

## 2020-02-23 MED ORDER — ACETAMINOPHEN 325 MG PO TABS: 650.0000 mg | ORAL_TABLET | Freq: Once | ORAL | Status: DC

## 2020-02-23 NOTE — ED Notes (Signed)
An After Visit Summary was printed and given to the patient. Discharge instructions given and no further questions at this time.  
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
## 2020-02-23 NOTE — Discharge Instructions (Addendum)
Take Motrin and Tylenol as needed as directed.  Return to ER for severe concerning symptoms otherwise recheck with your PCP.
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
## 2020-02-26 ENCOUNTER — Emergency Department (HOSPITAL_COMMUNITY): Payer: 59

## 2020-02-26 ENCOUNTER — Emergency Department (HOSPITAL_COMMUNITY)
Admission: EM | Admit: 2020-02-26 | Discharge: 2020-02-26 | Disposition: A | Discharge status: Home / Self Care | Payer: 59 | Attending: Emergency Medicine | Admitting: Emergency Medicine

## 2020-02-26 ENCOUNTER — Other Ambulatory Visit: Payer: Self-pay

## 2020-02-26 DIAGNOSIS — Y92410 Unspecified street and highway as the place of occurrence of the external cause: Secondary | ICD-10-CM | POA: Diagnosis not present

## 2020-02-26 DIAGNOSIS — Y999 Unspecified external cause status: Secondary | ICD-10-CM | POA: Diagnosis not present

## 2020-02-26 DIAGNOSIS — Y939 Activity, unspecified: Secondary | ICD-10-CM | POA: Diagnosis not present

## 2020-02-26 DIAGNOSIS — S0990XA Unspecified injury of head, initial encounter: Secondary | ICD-10-CM | POA: Diagnosis not present

## 2020-02-26 DIAGNOSIS — J45909 Unspecified asthma, uncomplicated: Secondary | ICD-10-CM | POA: Diagnosis not present

## 2020-02-26 DIAGNOSIS — S0990XD Unspecified injury of head, subsequent encounter: Secondary | ICD-10-CM

## 2020-02-26 LAB — POC URINE PREG, ED: Preg Test, Ur: NEGATIVE

## 2020-02-26 MED ORDER — METOCLOPRAMIDE HCL 10 MG PO TABS: 10.0000 mg | ORAL_TABLET | Freq: Four times a day (QID) | ORAL | 0 refills | Status: AC

## 2020-02-26 MED ORDER — METOCLOPRAMIDE HCL 10 MG PO TABS
10.0000 mg | ORAL_TABLET | Freq: Once | ORAL | Status: AC
  Administered 2020-02-26: 18:00:00 10 mg via ORAL
  Filled 2020-02-26: qty 1

## 2020-02-26 MED ORDER — PREDNISONE 20 MG PO TABS
60.0000 mg | ORAL_TABLET | Freq: Once | ORAL | Status: AC
  Administered 2020-02-26: 60 mg via ORAL
  Filled 2020-02-26: qty 3

## 2020-02-26 MED ORDER — PREDNISONE 10 MG (21) PO TBPK: ORAL_TABLET | ORAL | 0 refills | Status: AC
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
## 2020-02-26 NOTE — Discharge Instructions (Signed)
  Head Injury You have been seen today for a head injury. It does not appear to be serious at this time.  Close observation: The close observation period is usually 6 hours from the injury. This includes staying awake and having a trustworthy adult monitor you to assure your condition does not worsen. You should be in regular contact with this person and ideally, they should be able to monitor you in person.  Secondary observation: The secondary observation period is usually 24 hours from the injury. You are allowed to sleep during this time. A trustworthy adult should intermittently monitor you to assure your condition does not worsen.   Overall head injury/concussion care: Rest: Be sure to get plenty of rest. You will need more rest and sleep while you recover. Hydration: Be sure to stay well hydrated by having a goal of drinking about 0.5 liters of water an hour. Pain:  Acetaminophen: May take acetaminophen (generic for Tylenol), as needed, for pain. Your daily total maximum amount of acetaminophen from all sources should be limited to 4000mg /day for persons without liver problems, or 2000mg /day for those with liver problems. Metoclopramide: Metoclopramide (generic for Reglan) is a medication that can be used for nausea/vomiting and/or headache. Prednisone: Take the prednisone, as prescribed, until finished. If you are a diabetic, please know prednisone can raise your blood sugar temporarily. Return to sports and activities: In general, you may return to normal activities once symptoms have subsided, however, you would ideally be cleared by a primary care provider or other qualified medical professional prior to return to these activities.  Follow up: Follow up with the concussion clinic or your primary care provider for further management of this issue. Return: Return to the ED should you begin to have confusion, abnormal behavior, aggression, violence, or personality changes, repeated vomiting,  vision loss, numbness or weakness on one side of the body, difficulty standing due to dizziness, significantly worsening pain, or any other major concerns.

## 2023-08-10 DIAGNOSIS — H18621 Keratoconus, unstable, right eye: Secondary | ICD-10-CM | POA: Diagnosis not present

## 2023-08-10 DIAGNOSIS — H18622 Keratoconus, unstable, left eye: Secondary | ICD-10-CM | POA: Diagnosis not present

## 2023-09-14 DIAGNOSIS — H18621 Keratoconus, unstable, right eye: Secondary | ICD-10-CM | POA: Diagnosis not present

## 2023-09-21 DIAGNOSIS — H18621 Keratoconus, unstable, right eye: Secondary | ICD-10-CM | POA: Diagnosis not present

## 2023-10-21 DIAGNOSIS — L2089 Other atopic dermatitis: Secondary | ICD-10-CM | POA: Diagnosis not present

## 2023-10-21 DIAGNOSIS — E7800 Pure hypercholesterolemia, unspecified: Secondary | ICD-10-CM | POA: Diagnosis not present

## 2023-10-21 DIAGNOSIS — K219 Gastro-esophageal reflux disease without esophagitis: Secondary | ICD-10-CM | POA: Diagnosis not present

## 2023-10-21 DIAGNOSIS — G43009 Migraine without aura, not intractable, without status migrainosus: Secondary | ICD-10-CM | POA: Diagnosis not present

## 2023-10-21 DIAGNOSIS — J45909 Unspecified asthma, uncomplicated: Secondary | ICD-10-CM | POA: Diagnosis not present

## 2023-10-21 DIAGNOSIS — Z789 Other specified health status: Secondary | ICD-10-CM | POA: Diagnosis not present

## 2023-10-21 DIAGNOSIS — J309 Allergic rhinitis, unspecified: Secondary | ICD-10-CM | POA: Diagnosis not present

## 2023-10-21 DIAGNOSIS — R7303 Prediabetes: Secondary | ICD-10-CM | POA: Diagnosis not present

## 2023-10-21 DIAGNOSIS — E282 Polycystic ovarian syndrome: Secondary | ICD-10-CM | POA: Diagnosis not present

## 2023-10-21 DIAGNOSIS — Z0000 Encounter for general adult medical examination without abnormal findings: Secondary | ICD-10-CM | POA: Diagnosis not present

## 2023-10-21 DIAGNOSIS — Z124 Encounter for screening for malignant neoplasm of cervix: Secondary | ICD-10-CM | POA: Diagnosis not present

## 2023-11-04 DIAGNOSIS — Z789 Other specified health status: Secondary | ICD-10-CM | POA: Diagnosis not present

## 2023-11-04 DIAGNOSIS — N368 Other specified disorders of urethra: Secondary | ICD-10-CM | POA: Diagnosis not present

## 2023-11-04 DIAGNOSIS — N9489 Other specified conditions associated with female genital organs and menstrual cycle: Secondary | ICD-10-CM | POA: Diagnosis not present

## 2023-11-04 DIAGNOSIS — N941 Unspecified dyspareunia: Secondary | ICD-10-CM | POA: Diagnosis not present

## 2023-11-04 DIAGNOSIS — N94819 Vulvodynia, unspecified: Secondary | ICD-10-CM | POA: Diagnosis not present

## 2023-11-04 DIAGNOSIS — E282 Polycystic ovarian syndrome: Secondary | ICD-10-CM | POA: Diagnosis not present

## 2023-11-25 ENCOUNTER — Other Ambulatory Visit: Payer: Self-pay

## 2023-11-25 MED ORDER — UBRELVY 100 MG PO TABS
ORAL_TABLET | ORAL | 0 refills | Status: AC
  Filled 2023-12-03: qty 15, 29d supply, fill #0

## 2023-11-25 MED ORDER — LEVOCETIRIZINE DIHYDROCHLORIDE 5 MG PO TABS
5.0000 mg | ORAL_TABLET | Freq: Every day | ORAL | 0 refills | Status: AC
  Filled 2023-11-25 – 2023-12-03 (×2): qty 90, 90d supply, fill #0

## 2023-11-25 MED ORDER — PANTOPRAZOLE SODIUM 40 MG PO TBEC
40.0000 mg | DELAYED_RELEASE_TABLET | Freq: Every day | ORAL | 0 refills | Status: AC
  Filled 2023-11-25 – 2023-12-03 (×2): qty 90, 90d supply, fill #0

## 2023-11-25 MED ORDER — MECLIZINE HCL 25 MG PO TABS
25.0000 mg | ORAL_TABLET | Freq: Three times a day (TID) | ORAL | 1 refills | Status: AC | PRN
  Filled 2023-11-25 – 2023-12-03 (×2): qty 30, 5d supply, fill #0

## 2023-11-25 MED ORDER — FLUTICASONE PROPIONATE HFA 44 MCG/ACT IN AERO: 2.0000 | INHALATION_SPRAY | Freq: Two times a day (BID) | RESPIRATORY_TRACT | 1 refills | Status: AC

## 2023-11-25 MED ORDER — IPRATROPIUM-ALBUTEROL 0.5-2.5 (3) MG/3ML IN SOLN
3.0000 mL | Freq: Three times a day (TID) | RESPIRATORY_TRACT | 3 refills | Status: AC | PRN
  Filled 2023-11-25 – 2023-12-03 (×2): qty 90, 10d supply, fill #0

## 2023-11-25 MED ORDER — MONTELUKAST SODIUM 10 MG PO TABS
10.0000 mg | ORAL_TABLET | Freq: Every day | ORAL | 11 refills | Status: AC
  Filled 2023-11-25 – 2023-12-03 (×2): qty 30, 30d supply, fill #0

## 2023-11-25 MED ORDER — ALBUTEROL SULFATE HFA 108 (90 BASE) MCG/ACT IN AERS
1.0000 | INHALATION_SPRAY | RESPIRATORY_TRACT | 3 refills | Status: AC
  Filled 2023-11-25 – 2023-12-03 (×2): qty 6.7, 33d supply, fill #0

## 2023-11-25 MED ORDER — CLOBETASOL PROPIONATE 0.05 % EX CREA
1.0000 | TOPICAL_CREAM | Freq: Two times a day (BID) | CUTANEOUS | 1 refills | Status: AC
  Filled 2023-11-25: qty 15, 10d supply, fill #0
  Filled 2023-12-03: qty 15, 15d supply, fill #0

## 2023-11-26 ENCOUNTER — Other Ambulatory Visit: Payer: Self-pay

## 2023-12-03 ENCOUNTER — Other Ambulatory Visit: Payer: Self-pay

## 2023-12-07 ENCOUNTER — Other Ambulatory Visit: Payer: Self-pay

## 2023-12-10 ENCOUNTER — Other Ambulatory Visit: Payer: Self-pay

## 2023-12-22 ENCOUNTER — Other Ambulatory Visit (HOSPITAL_COMMUNITY): Payer: Self-pay

## 2023-12-22 ENCOUNTER — Other Ambulatory Visit: Payer: Self-pay

## 2023-12-25 ENCOUNTER — Other Ambulatory Visit (HOSPITAL_COMMUNITY): Payer: Self-pay

## 2024-01-07 DIAGNOSIS — Z3181 Encounter for male factor infertility in female patient: Secondary | ICD-10-CM | POA: Diagnosis not present

## 2024-01-07 DIAGNOSIS — E282 Polycystic ovarian syndrome: Secondary | ICD-10-CM | POA: Diagnosis not present

## 2024-01-07 DIAGNOSIS — Z319 Encounter for procreative management, unspecified: Secondary | ICD-10-CM | POA: Diagnosis not present

## 2024-01-07 DIAGNOSIS — N970 Female infertility associated with anovulation: Secondary | ICD-10-CM | POA: Diagnosis not present

## 2024-01-07 DIAGNOSIS — E288 Other ovarian dysfunction: Secondary | ICD-10-CM | POA: Diagnosis not present

## 2024-01-08 ENCOUNTER — Other Ambulatory Visit: Payer: Self-pay

## 2024-01-11 ENCOUNTER — Other Ambulatory Visit: Payer: Self-pay

## 2024-01-11 MED ORDER — METFORMIN HCL 500 MG PO TABS
ORAL_TABLET | ORAL | 3 refills | Status: AC
  Filled 2024-01-11: qty 60, 30d supply, fill #0
  Filled 2024-02-22: qty 60, 5d supply, fill #1

## 2024-01-18 ENCOUNTER — Other Ambulatory Visit: Payer: Self-pay

## 2024-01-21 ENCOUNTER — Other Ambulatory Visit: Payer: Self-pay

## 2024-01-21 DIAGNOSIS — E282 Polycystic ovarian syndrome: Secondary | ICD-10-CM | POA: Diagnosis not present

## 2024-01-21 DIAGNOSIS — Z319 Encounter for procreative management, unspecified: Secondary | ICD-10-CM | POA: Diagnosis not present

## 2024-01-21 DIAGNOSIS — N970 Female infertility associated with anovulation: Secondary | ICD-10-CM | POA: Diagnosis not present

## 2024-01-21 MED ORDER — OVIDREL 250 MCG/0.5ML ~~LOC~~ SOSY
PREFILLED_SYRINGE | SUBCUTANEOUS | 3 refills | Status: AC
  Filled 2024-01-21: qty 0.5, 30d supply, fill #0

## 2024-01-21 MED ORDER — LETROZOLE 2.5 MG PO TABS
2.5000 mg | ORAL_TABLET | ORAL | 3 refills | Status: AC
  Filled 2024-02-22: qty 15, 5d supply, fill #1
  Filled 2024-03-25: qty 15, 5d supply, fill #2

## 2024-01-21 MED ORDER — MEDROXYPROGESTERONE ACETATE 10 MG PO TABS: 10.0000 mg | ORAL_TABLET | Freq: Every day | ORAL | 3 refills | Status: AC

## 2024-01-22 ENCOUNTER — Other Ambulatory Visit: Payer: Self-pay

## 2024-02-10 DIAGNOSIS — Z3169 Encounter for other general counseling and advice on procreation: Secondary | ICD-10-CM | POA: Diagnosis not present

## 2024-02-10 DIAGNOSIS — E288 Other ovarian dysfunction: Secondary | ICD-10-CM | POA: Diagnosis not present

## 2024-02-22 ENCOUNTER — Other Ambulatory Visit: Payer: Self-pay

## 2024-02-23 ENCOUNTER — Other Ambulatory Visit: Payer: Self-pay

## 2024-02-26 DIAGNOSIS — Z3169 Encounter for other general counseling and advice on procreation: Secondary | ICD-10-CM | POA: Diagnosis not present

## 2024-02-26 DIAGNOSIS — Z319 Encounter for procreative management, unspecified: Secondary | ICD-10-CM | POA: Diagnosis not present

## 2024-02-26 DIAGNOSIS — N83201 Unspecified ovarian cyst, right side: Secondary | ICD-10-CM | POA: Diagnosis not present

## 2024-03-04 DIAGNOSIS — Z3189 Encounter for other procreative management: Secondary | ICD-10-CM | POA: Diagnosis not present

## 2024-03-25 ENCOUNTER — Other Ambulatory Visit: Payer: Self-pay

## 2024-03-31 ENCOUNTER — Other Ambulatory Visit: Payer: Self-pay

## 2024-03-31 DIAGNOSIS — J988 Other specified respiratory disorders: Secondary | ICD-10-CM | POA: Diagnosis not present

## 2024-03-31 MED ORDER — AMOXICILLIN-POT CLAVULANATE 875-125 MG PO TABS
1.0000 | ORAL_TABLET | Freq: Two times a day (BID) | ORAL | 0 refills | Status: AC
  Filled 2024-03-31: qty 20, 10d supply, fill #0

## 2024-03-31 MED ORDER — IPRATROPIUM BROMIDE 0.03 % NA SOLN
2.0000 | Freq: Two times a day (BID) | NASAL | 1 refills | Status: AC
  Filled 2024-03-31: qty 30, 75d supply, fill #0

## 2024-04-04 DIAGNOSIS — Z319 Encounter for procreative management, unspecified: Secondary | ICD-10-CM | POA: Diagnosis not present

## 2024-04-22 DIAGNOSIS — Z3200 Encounter for pregnancy test, result unknown: Secondary | ICD-10-CM | POA: Diagnosis not present

## 2024-04-25 DIAGNOSIS — Z3200 Encounter for pregnancy test, result unknown: Secondary | ICD-10-CM | POA: Diagnosis not present

## 2024-05-11 DIAGNOSIS — Z3200 Encounter for pregnancy test, result unknown: Secondary | ICD-10-CM | POA: Diagnosis not present

## 2024-05-18 DIAGNOSIS — O021 Missed abortion: Secondary | ICD-10-CM | POA: Diagnosis not present

## 2024-05-26 DIAGNOSIS — O021 Missed abortion: Secondary | ICD-10-CM | POA: Diagnosis not present

## 2024-05-26 DIAGNOSIS — Z319 Encounter for procreative management, unspecified: Secondary | ICD-10-CM | POA: Diagnosis not present

## 2024-05-27 ENCOUNTER — Other Ambulatory Visit: Payer: Self-pay

## 2024-05-27 MED ORDER — TRAMADOL HCL 50 MG PO TABS
50.0000 mg | ORAL_TABLET | Freq: Four times a day (QID) | ORAL | 0 refills | Status: AC | PRN
  Filled 2024-05-27: qty 6, 2d supply, fill #0

## 2024-05-27 MED ORDER — MISOPROSTOL 200 MCG PO TABS
800.0000 ug | ORAL_TABLET | Freq: Once | ORAL | 1 refills | Status: AC
  Filled 2024-05-27: qty 4, 1d supply, fill #0

## 2024-07-22 DIAGNOSIS — R0789 Other chest pain: Secondary | ICD-10-CM | POA: Diagnosis not present

## 2024-07-24 NOTE — Progress Notes (Signed)
 [address] - AMBULATORY [facility] [address] [place] [id number]   Date of Service: 07/22/2024 Patient DOB: [birth year]    History of Present Illness   Patient ID: Greidy Sherard is a 28 y.o. female. Patient presents to urgent care with significant other due to substernal chest discomfort that started yesterday. States that she feels like there is gas trapped in her chest and describes as a pressure. Reports sx started after eating fish and possible swallowing a bone. Has been using Gasx with minimal relief. Notes that she is belching and passing gas with normal PO intake. Also endorses taking daily PPI for GERD. Denies cardiac or VTE hx. Denies dysphagia, dizziness, n/v, abd pain, diarrhea.  Active Ambulatory Problems    Diagnosis Date Noted  . Throat pain in adult 12/22/2018  . Dysfunction of both eustachian tubes 12/22/2018  . Persistent headaches 10/05/2019  . Post-nasal drainage 10/05/2019  . Nasal congestion 10/05/2019  . Concussion without loss of consciousness 02/29/2020   Resolved Ambulatory Problems    Diagnosis Date Noted  . No Resolved Ambulatory Problems   Past Medical History:  Diagnosis Date  . Acid reflux   . Asthma (CMD)   . Headache     BP 133/89 (BP Location: Left arm, Patient Position: Sitting)   Pulse 80   Ht 1.651 m (5' 5)   Wt 78.5 kg (173 lb)   BMI 28.79 kg/m    Review of Systems   Review of Systems  Cardiovascular:        Chest pressure     Physicial Exam   Physical Exam Vitals reviewed.  Constitutional:      Appearance: Normal appearance. She is normal weight. She is not ill-appearing or diaphoretic.  HENT:     Head: Normocephalic and atraumatic.     Right Ear: External ear normal.     Left Ear: External ear normal.     Nose: Nose normal.     Mouth/Throat:     Mouth: Mucous membranes are moist.     Tongue: No lesions. Tongue does  not deviate from midline.     Pharynx: Oropharynx is clear. Uvula midline. No posterior oropharyngeal erythema or uvula swelling.     Tonsils: No tonsillar exudate or tonsillar abscesses.   Eyes:     Extraocular Movements: Extraocular movements intact.     Conjunctiva/sclera: Conjunctivae normal.    Cardiovascular:     Rate and Rhythm: Normal rate and regular rhythm.     Pulses: Normal pulses.     Heart sounds: Normal heart sounds. No murmur heard. Pulmonary:     Effort: Pulmonary effort is normal.     Breath sounds: Normal breath sounds.  Abdominal:     General: Abdomen is flat. Bowel sounds are normal. There is no distension.     Palpations: Abdomen is soft. There is no mass.     Tenderness: There is no abdominal tenderness. There is no guarding or rebound.     Hernia: No hernia is present.   Musculoskeletal:        General: Normal range of motion.     Cervical back: Normal range of motion and neck supple.   Skin:    General: Skin is warm and dry.   Neurological:     General: No focal deficit present.     Mental Status: She is alert and oriented to person, place, and time.   Psychiatric:  Mood and Affect: Mood normal.     Diagnosis   Luciel was seen today for swallowed foreign body.  Diagnoses and all orders for this visit:  Sensation of chest pressure     Medical Decision Making Shalondra Amalya Salmons is a 28 y.o. female who presents to urgent care today with complaints of  Chief Complaint  Patient presents with  . Swallowed Foreign Body    Patient states she ate fish yesterday and believes she swallowed a bone. Patient states it feels like air is trapped.     On exam, VS stable and patient is afebrile. Appears well-hydrated and capillary refill <2 seconds.  Ddx: GERD, MI, FB, cholecystitis, pancreatitis   Patient presents to urgent care due to pressure sensation in chest. Pt very concerned about possible swallowing a fish bone. On exam, pt does  not appear to exhibit any abnormal findings. I do not feel that imagine is needed. Pt is able to tolerate PO intake without difficulty. Pt does not exhibit any red flag sx at this time. F/u with PCP.  Discharge Information  Symptomatic management discussed.  Patient was given verbal and written instructions on symptoms that necessitate return to the UC/ED, and instructed to f/u w/ UC or PCP if not improving in expected timeframe.   Patient/parent has been instructed on RX/OTC medications, dosages, side effects, and possible interactions as associated with each diagnosis in my impression and plan above.   Patient education (verbal/handout) given on diagnosis, pathophysiology, treatment of diagnosis, side effects of medication use for treatment, restrictions while taking medication, supportives measures such as staying hydrated.   Red Flags associated with diagnosis/es were reviewed and patient instructed on action plan if red flags develop.   They have been instructed that if symptoms worsen or red flags develop they should return to Urgent Care, go to the nearest ED, or activate EMS/911.     Patient and/or parent/guardian (if applicable) agreed with plan and voiced understanding.  No barriers to adherence perceived by myself.   Portions of this note may have been dictated using Dragon dictation software/hardware and may contain grammatical or spelling errors.    If a new prescription was given today, then I discussed potential side effects, drug interactions, instructions for taking the medication, and the consequences of not taking it.    F/u: Follow up closely with primary care provider (PCP) and other specialists for further care and routine care, but seek medical attention sooner if worsening/concerning signs or symptoms.  Follow up with PCP   Electronically signed by: Darryle Slater Fish, PA-C 07/24/2024 7:45 PM

## 2024-08-01 DIAGNOSIS — Z319 Encounter for procreative management, unspecified: Secondary | ICD-10-CM | POA: Diagnosis not present

## 2024-08-01 DIAGNOSIS — Z3200 Encounter for pregnancy test, result unknown: Secondary | ICD-10-CM | POA: Diagnosis not present

## 2024-08-02 DIAGNOSIS — N96 Recurrent pregnancy loss: Secondary | ICD-10-CM | POA: Diagnosis not present

## 2024-08-02 DIAGNOSIS — E282 Polycystic ovarian syndrome: Secondary | ICD-10-CM | POA: Diagnosis not present

## 2024-08-11 DIAGNOSIS — Z3149 Encounter for other procreative investigation and testing: Secondary | ICD-10-CM | POA: Diagnosis not present

## 2024-08-12 ENCOUNTER — Other Ambulatory Visit: Payer: Self-pay

## 2024-08-12 MED ORDER — MEDROXYPROGESTERONE ACETATE 10 MG PO TABS
10.0000 mg | ORAL_TABLET | Freq: Every day | ORAL | 0 refills | Status: AC
  Filled 2024-08-12: qty 10, 10d supply, fill #0

## 2024-08-16 ENCOUNTER — Other Ambulatory Visit: Payer: Self-pay

## 2024-09-30 DIAGNOSIS — R8290 Unspecified abnormal findings in urine: Secondary | ICD-10-CM | POA: Diagnosis not present

## 2024-09-30 DIAGNOSIS — R1011 Right upper quadrant pain: Secondary | ICD-10-CM | POA: Diagnosis not present

## 2024-09-30 DIAGNOSIS — Z23 Encounter for immunization: Secondary | ICD-10-CM | POA: Diagnosis not present

## 2024-10-08 ENCOUNTER — Ambulatory Visit
Admission: EM | Admit: 2024-10-08 | Discharge: 2024-10-08 | Disposition: A | Discharge status: Home / Self Care | Attending: Physician Assistant | Admitting: Physician Assistant

## 2024-10-08 ENCOUNTER — Encounter: Payer: Self-pay | Admitting: Emergency Medicine

## 2024-10-08 DIAGNOSIS — K219 Gastro-esophageal reflux disease without esophagitis: Secondary | ICD-10-CM

## 2024-10-08 DIAGNOSIS — R1011 Right upper quadrant pain: Secondary | ICD-10-CM | POA: Diagnosis not present

## 2024-10-08 MED ORDER — SUCRALFATE 1 G PO TABS: 1.0000 g | ORAL_TABLET | Freq: Three times a day (TID) | ORAL | 0 refills | Status: DC

## 2024-10-08 NOTE — Discharge Instructions (Signed)
-   Your vitals all look good today. - I reviewed all of your lab work which was reassuring. - You do not have any tenderness upon palpation of your abdomen which is reassuring as well. - The next step to determining what is causing your symptoms is to have an ultrasound done.  High suspicion for gallstones.  Please contact your PCP on Monday and request an ultrasound. - Continue antacids.  I sent Carafate to pharmacy to start as well. - If at any point you experience acute worsening of the pain, fever, sweats, vomiting, dizziness, chest pain, breathing problem or weakness please go immediately to the ER for more emergent workup.

## 2024-10-08 NOTE — ED Triage Notes (Signed)
 Patient c/o RUQ abdominal pain that started over a week ago.  Patient denies N/V/D.  Patient reports dull pain that comes and goes.  Patient saw her PCP last week and did blood work and UA and was all WNL.  Patient denies fevers.

## 2024-10-08 NOTE — ED Provider Notes (Signed)
 [facility]    CSN: [id number] Arrival date & time: 10/08/24  1539      History   Chief Complaint Chief Complaint  Patient presents with   Abdominal Pain    RUQ    HPI Tammy French is a 28 y.o. female presenting for 2 week history of intermittent dull right upper quadrant pain. Denies fever, fatigue, nausea, vomiting, diarrhea, constipation, dysuria, flank pain, hematuria, appetite changes, chest pain, shortness of breath, and weakness. Denies known exacerbating or relieving factors. Symptoms not better or worse after eating. No recent worsening of symptoms.   Patient seen 09/30/2024 by PCP and had lab workup. See below. No significant findings and no further work up or treatment discussed with patient.  Takes pantoprazole  for GERD. Other history significant for asthma, PCOS and migraines.   HPI  Past Medical History:  Diagnosis Date   Asthma    GERD (gastroesophageal reflux disease)    Migraines    PCOS (polycystic ovarian syndrome)     There are no active problems to display for this patient.   Past Surgical History:  Procedure Laterality Date   NO PAST SURGERIES      OB History   No obstetric history on file.      Home Medications    Prior to Admission medications   Medication Sig Start Date End Date Taking? Authorizing Provider  sucralfate (CARAFATE) 1 g tablet Take 1 tablet (1 g total) by mouth 4 (four) times daily -  with meals and at bedtime. 10/08/24  Yes Arvis Jolan NOVAK, PA-C  albuterol  (PROVENTIL  HFA;VENTOLIN  HFA) 108 (90 Base) MCG/ACT inhaler Inhale 2 puffs into the lungs every 6 (six) hours as needed for wheezing or shortness of breath.    [provider]  albuterol  (PROVENTIL ) (2.5 MG/3ML) 0.083% nebulizer solution Take 3 mLs (2.5 mg total) by nebulization every 6 (six) hours as needed for wheezing or shortness of breath. 01/24/17   Jamelle Lorrayne HERO, NP  albuterol  (VENTOLIN  HFA) 108 (90 Base) MCG/ACT inhaler Inhale 1 puff into  the lungs every 4 (four) hours. 11/25/23     amoxicillin -clavulanate (AUGMENTIN ) 875-125 MG tablet Take 1 tablet by mouth every 12 (twelve) hours FOR 10 DAYS. 03/31/24     Calcium Polycarbophil (FIBER-CAPS PO) Take 1 capsule by mouth daily.    [provider]  Choriogonadotropin Alfa  (OVIDREL ) 250 MCG/0.5ML SOSY Inject 1 prefilled syringe as directed by provider 01/21/24     clobetasol  cream (TEMOVATE ) 0.05 % Apply 1 Application topically 2 (two) times daily for 10 days. 11/25/23     docusate sodium  (COLACE) 100 MG capsule Take 1 capsule by mouth daily. 09/14/16   [provider]  Fexofenadine-Pseudoephedrine (ALLEGRA-D PO) Take by mouth.    [provider]  fluticasone  (FLONASE ) 50 MCG/ACT nasal spray Place into both nostrils daily.    [provider]  fluticasone  (FLOVENT  HFA) 44 MCG/ACT inhaler Inhale 2 puffs into the lungs 2 (two) times daily. 11/25/23   Pahwani, Rinka R, MD  ipratropium (ATROVENT ) 0.03 % nasal spray Place 2 sprays into the nose 2 (two) times daily. 03/31/24     ipratropium (ATROVENT ) 0.06 % nasal spray Place 2 sprays into both nostrils 4 (four) times daily. 10/02/19   Anitra Rocky KIDD, PA-C  ipratropium-albuterol  (DUONEB) 0.5-2.5 (3) MG/3ML SOLN Inhale 3 mLs into the lungs 3 (three) times daily as needed for asthma flare 11/25/23     lactulose , encephalopathy, (GENERLAC ) 10 GM/15ML SOLN Take 15ml twice daily 05/25/18  Teressa Toribio SQUIBB, MD  letrozole  (FEMARA ) 2.5 MG tablet Take 3 tablets by mouth on days 3 through 7 on cycle days 01/21/24     levocetirizine (XYZAL ) 5 MG tablet Take 1 tablet (5 mg total) by mouth every evening. 10/02/19   Anitra Rocky KIDD, PA-C  levocetirizine (XYZAL ) 5 MG tablet Take 1 tablet (5 mg total) by mouth daily. 11/25/23     lubiprostone  (AMITIZA ) 8 MCG capsule Take 1 capsule (8 mcg total) by mouth 2 (two) times daily with a meal. 05/05/18   Teressa Toribio SQUIBB, MD  meclizine  (ANTIVERT ) 25 MG tablet Take 1-2 tablets (25-50 mg total)  by mouth 3 (three) times daily as needed for vertigo or nausea 11/25/23     medroxyPROGESTERone  (PROVERA ) 10 MG tablet Take 1 tablet (10 mg total) by mouth daily for 5 days. 01/21/24     medroxyPROGESTERone  (PROVERA ) 10 MG tablet Take 1 tablet (10 mg total) by mouth daily. 08/12/24     metFORMIN  (GLUCOPHAGE ) 500 MG tablet Take 0.5 tablets (250 mg total) by mouth daily for 7 days, THEN 0.5 tablets (250 mg total) 2 (two) times daily for 7 days, THEN 1 tablet (500 mg total) every morning and 0.5 tablets (250 mg total) at bedtime for 7 days, THEN 1 tablet (500 mg total) 2 (two) times daily. 01/11/24 02/28/24    metFORMIN  (GLUCOPHAGE -XR) 750 MG 24 hr tablet Take 750 mg by mouth at bedtime. 02/09/18   [provider]  methocarbamol  (ROBAXIN ) 500 MG tablet Take 1 tablet (500 mg total) by mouth 2 (two) times daily. 08/03/18   Delwyn Damien PARAS, PA-C  metoCLOPramide  (REGLAN ) 10 MG tablet Take 1 tablet (10 mg total) by mouth every 6 (six) hours. 02/26/20   Joy, Shawn C, PA-C  misoprostol  (CYTOTEC ) 200 MCG tablet Place 4 tablets (800 mcg total) vaginally once for 1 dose. May repeat in 48 hours if no bleeding occurs. 05/27/24 05/28/24    montelukast  (SINGULAIR ) 10 MG tablet Take 10 mg by mouth daily. 09/19/19   [provider]  montelukast  (SINGULAIR ) 10 MG tablet Take 1 tablet (10 mg total) by mouth daily. 11/25/23     Multiple Vitamin (MULTIVITAMIN) tablet Take 1 tablet by mouth daily.    [provider]  pantoprazole  (PROTONIX ) 40 MG tablet Take 1 tablet (40 mg total) by mouth daily. 11/25/23     Plecanatide  (TRULANCE ) 3 MG TABS Take 1 tablet by mouth daily. 05/14/18   Charlanne Groom, MD  predniSONE  (STERAPRED UNI-PAK 21 TAB) 10 MG (21) TBPK tablet Take 6 tabs (60mg ) day 1, 5 tabs (50mg ) day 2, 4 tabs (40mg ) day 3, 3 tabs (30mg ) day 4, 2 tabs (20mg ) day 5, and 1 tab (10mg ) day 6. 02/26/20   Joy, Shawn C, PA-C  spironolactone (ALDACTONE) 50 MG tablet Take 50 mg by mouth at bedtime. 02/09/18    [provider]  traMADol  (ULTRAM ) 50 MG tablet Take 1 tablet (50 mg total) by mouth every 6 (six) hours as needed. 05/27/24     Ubrogepant  (UBRELVY ) 100 MG TABS Take 1 tablet may take second dose at least 2 hours after first dose as needed. 11/25/23       Family History Family History  Problem Relation Age of Onset   Diabetes Maternal Grandmother    Colon cancer Neg Hx     Social History Social History   Tobacco Use   Smoking status: Never   Smokeless tobacco: Never  Vaping Use   Vaping status: Never Used  Substance Use Topics   Alcohol use: No   Drug use: No     Allergies   Sumatriptan   Review of Systems Review of Systems  Constitutional:  Negative for appetite change, fatigue and fever.  HENT:  Negative for congestion.   Respiratory:  Negative for cough and shortness of breath.   Cardiovascular:  Negative for chest pain.  Gastrointestinal:  Positive for abdominal pain. Negative for abdominal distention, constipation, diarrhea, nausea and vomiting.  Genitourinary:  Negative for difficulty urinating, dysuria, flank pain, frequency, hematuria, pelvic pain and vaginal discharge.  Musculoskeletal:  Negative for back pain.  Neurological:  Negative for dizziness and weakness.     Physical Exam Triage Vital Signs ED Triage Vitals  Encounter Vitals Group     BP      Girls Systolic BP Percentile      Girls Diastolic BP Percentile      Boys Systolic BP Percentile      Boys Diastolic BP Percentile      Pulse      Resp      Temp      Temp src      SpO2      Weight      Height      Head Circumference      Peak Flow      Pain Score      Pain Loc      Pain Education      Exclude from Growth Chart    No data found.  Updated Vital Signs BP (!) 139/90 (BP Location: Right Arm)   Pulse 68   Temp 98.6 F (37 C) (Oral)   Resp 15   Ht 5' 6 (1.676 m)   Wt 177 lb 0.5 oz (80.3 kg)   LMP 09/22/2024 (Exact Date)   SpO2 96%   BMI 28.57 kg/m      Physical Exam Vitals and nursing note reviewed.  Constitutional:      General: She is not in acute distress.    Appearance: Normal appearance. She is not ill-appearing or toxic-appearing.  HENT:     Head: Normocephalic and atraumatic.     Nose: Nose normal.     Mouth/Throat:     Mouth: Mucous membranes are moist.     Pharynx: Oropharynx is clear.  Eyes:     General: No scleral icterus.       Right eye: No discharge.        Left eye: No discharge.     Conjunctiva/sclera: Conjunctivae normal.  Cardiovascular:     Rate and Rhythm: Normal rate and regular rhythm.     Heart sounds: Normal heart sounds.  Pulmonary:     Effort: Pulmonary effort is normal. No respiratory distress.     Breath sounds: Normal breath sounds.  Abdominal:     Tenderness: There is no abdominal tenderness.  Musculoskeletal:     Cervical back: Neck supple.  Skin:    General: Skin is dry.  Neurological:     General: No focal deficit present.     Mental Status: She is alert. Mental status is at baseline.     Motor: No weakness.     Gait: Gait normal.  Psychiatric:        Mood and Affect: Mood normal.        Behavior: Behavior normal.      UC Treatments / Results  Labs (all labs ordered are listed, but only abnormal results are displayed) Labs Reviewed -  No data to display Results Component Value Reference Range Notes  CRP, Quant ([id number]) Reviewed date:10/03/2024 08:36:39 AM Interpretation: Performing Lab: Notes/Report: Testing performed at: [BN] [facility], [address], [place], [place], [address], Phone: [phone], Laboratory Director: Frankey Sas, MD  C-Reactive Protein, Quant 1 0-10 mg/L    Sed Rate (ESR) Reviewed date:10/03/2024 08:36:39 AM Interpretation: Performing Lab: Notes/Report: Testing performed at: [BN] [facility], [address], [place], [place], [address], Phone: [phone], Laboratory Director: Frankey Sas, MD  Sedimentation Rate-Westergren  6 0-32 mm/hr    Lipase ([id number]) Reviewed date:10/03/2024 08:36:39 AM Interpretation: Performing Lab: Notes/Report: Testing performed at: [BN] [facility], [address], [place], [place], [address], Phone: [phone], Laboratory Director: Frankey Sas, MD  Lipase 29 14-72 U/L    Comp Metabolic Panel Reviewed date:10/03/2024 08:36:39 AM Interpretation: Performing Lab: Notes/Report: Testing performed at: [BN] [facility], [address] [place], [place], [address], Phone: [phone], Laboratory Director: Frankey Sas, MD  Glucose 74 70-99 mg/dL    BUN 10 3-79 mg/dL    Creatinine 9.30 9.42-8.99 mg/dL    eGFR 878 >40 fO/fpw/8.26    Sodium 138 134-144 mmol/L    Potassium 3.8 3.5-5.2 mmol/L    Chloride 101 96-106 mmol/L    Carbon Dioxide, Total 24 20-29 mmol/L    Calcium 9.6 8.7-10.2 mg/dL    CA-corrected 1.11 1.39-89.69 mg/dL    Protein, Total 7.8 3.9-1.4 g/dL    Albumin 4.9 5.9-4.9 g/dL    Bilirubin, Total 0.4 0.0-1.2 mg/dL    Alkaline Phosphatase 41 41-116 IU/L    AST (SGOT) 20 0-40 IU/L    ALT (SGPT) 17 0-32 IU/L    BUN/Creatinine Ratio 14 9-23    Globulin, Total 2.9 1.5-4.5 g/dL    CBC with Diff Reviewed date:10/03/2024 08:36:39 AM Interpretation: Performing Lab: Notes/Report: Testing performed at: [BN] [facility], [address], [place], [place], [address], Phone: [phone], Laboratory Director: Frankey Sas, MD  WBC 7.5 3.4-10.8 x10E3/uL    RBC 4.59 3.77-5.28 x10E6/uL    Hemoglobin 12.8 11.1-15.9 g/dL    Hematocrit 59.8 65.9-53.3 %    MCV 87 79-97 fL    MCH 27.9 26.6-33.0 pg    MCHC 31.9 31.5-35.7 g/dL    RDW 86.8 88.2-84.5 %    Platelets 325 150-450 x10E3/uL    Neutrophils 58 Not Estab. %    Lymphs 34 Not Estab. %    Monocytes 6 Not Estab. %    Eos 2 Not Estab. %    Basos 0 Not Estab. %    Neutrophils (Absolute) 4.3 1.4-7.0 x10E3/uL    Lymphs (Absolute) 2.6 0.7-3.1 x10E3/uL    Monocytes(Absolute) 0.4 0.1-0.9 x10E3/uL     Eos (Absolute) 0.1 0.0-0.4 x10E3/uL    Baso (Absolute) 0.0 0.0-0.2 x10E3/uL    Immature Granulocytes 0 Not Estab. %    Immature Grans (Abs) 0.0 0.0-0.1 x10E3/uL    Culture, Urine Routine ([id number]) Reviewed date:10/03/2024 08:36:39 AM Interpretation: Performing Lab: Notes/Report: Reported Clinical Info Relevant to Order: DMR:lmpwz 10,000-25,000 colony forming units per mL Reported Source: urine Mixed urogenital flora Testing performed at: [BN] [facility], [address], [place], [place], [address], Phone: [phone], Laboratory Director: Frankey Sas, MD  Urine Culture, Routine Final report      Result 1 Comment       EKG   Radiology No results found.  Procedures Procedures (including critical care time)  Medications Ordered in UC Medications - No data to display  Initial Impression / Assessment and Plan / UC Course  I have reviewed the triage vital signs and the  nursing notes.  Pertinent labs & imaging results that were available during my care of the patient were reviewed by me and considered in my medical decision making (see chart for details).   27 year old female presents for 2-week history of intermittent dull right upper quadrant pain.  Symptoms not recently worse.  No associated appetite changes, nausea/vomiting, constipation, diarrhea, urinary symptoms.  No relationship to food.  Patient was seen by PCP on 10/17 and labs resulted on 10/03/24. CBC, CMP, lipase, CRP, ESR, urinalysis and urine culture all reviewed.  No significant findings.  All results included above.  Vitals are stable and normal today.  She is afebrile and overall well-appearing.  She has no abdominal tenderness, guarding, rebound, CVA tenderness on today's exam and chest is clear with heart regular rate and rhythm.  High suspicion for cholelithiasis.  Advised patient next up would be obtaining a right upper quadrant ultrasound.  Since no recent worsening of symptoms and stable vital  signs with no abdominal tenderness at this time.  Low suspicion for cholecystitis, pancreatitis, choledocholithiasis or other infection.  Advised safe to wait to contact PCP office until Monday unless symptoms acutely worsen and I outlined to ED red flag signs and symptoms.  Should also continue pantoprazole  for GERD.  Will add Carafate to see if that could helpful.   Final Clinical Impressions(s) / UC Diagnoses   Final diagnoses:  Abdominal pain, right upper quadrant  Gastroesophageal reflux disease, unspecified whether esophagitis present     Discharge Instructions      - Your vitals all look good today. - I reviewed all of your lab work which was reassuring. - You do not have any tenderness upon palpation of your abdomen which is reassuring as well. - The next step to determining what is causing your symptoms is to have an ultrasound done.  High suspicion for gallstones.  Please contact your PCP on Monday and request an ultrasound. - Continue antacids.  I sent Carafate to pharmacy to start as well. - If at any point you experience acute worsening of the pain, fever, sweats, vomiting, dizziness, chest pain, breathing problem or weakness please go immediately to the ER for more emergent workup.     ED Prescriptions     Medication Sig Dispense Auth. Provider   sucralfate (CARAFATE) 1 g tablet Take 1 tablet (1 g total) by mouth 4 (four) times daily -  with meals and at bedtime. 120 tablet Kayhan Boardley B, PA-C      PDMP not reviewed this encounter.   Arvis Jolan NOVAK, PA-C 10/09/24 [phone]

## 2024-10-10 ENCOUNTER — Other Ambulatory Visit: Payer: Self-pay | Admitting: Internal Medicine

## 2024-10-10 DIAGNOSIS — R109 Unspecified abdominal pain: Secondary | ICD-10-CM

## 2024-10-11 ENCOUNTER — Emergency Department (HOSPITAL_BASED_OUTPATIENT_CLINIC_OR_DEPARTMENT_OTHER)

## 2024-10-11 ENCOUNTER — Emergency Department (HOSPITAL_BASED_OUTPATIENT_CLINIC_OR_DEPARTMENT_OTHER)
Admission: EM | Admit: 2024-10-11 | Discharge: 2024-10-12 | Disposition: A | Discharge status: Home / Self Care | Attending: Emergency Medicine | Admitting: Emergency Medicine

## 2024-10-11 ENCOUNTER — Emergency Department (HOSPITAL_BASED_OUTPATIENT_CLINIC_OR_DEPARTMENT_OTHER): Admitting: Radiology

## 2024-10-11 ENCOUNTER — Other Ambulatory Visit: Payer: Self-pay

## 2024-10-11 DIAGNOSIS — R1011 Right upper quadrant pain: Secondary | ICD-10-CM | POA: Insufficient documentation

## 2024-10-11 DIAGNOSIS — K7689 Other specified diseases of liver: Secondary | ICD-10-CM | POA: Diagnosis not present

## 2024-10-11 DIAGNOSIS — R079 Chest pain, unspecified: Secondary | ICD-10-CM | POA: Diagnosis not present

## 2024-10-11 DIAGNOSIS — R1013 Epigastric pain: Secondary | ICD-10-CM | POA: Insufficient documentation

## 2024-10-11 LAB — COMPREHENSIVE METABOLIC PANEL WITH GFR
ALT: 17 U/L (ref 0–44)
AST: 19 U/L (ref 15–41)
Albumin: 5.2 g/dL — ABNORMAL HIGH (ref 3.5–5.0)
Alkaline Phosphatase: 44 U/L (ref 38–126)
Anion gap: 11 (ref 5–15)
BUN: 8 mg/dL (ref 6–20)
CO2: 28 mmol/L (ref 22–32)
Calcium: 10.1 mg/dL (ref 8.9–10.3)
Chloride: 101 mmol/L (ref 98–111)
Creatinine, Ser: 0.66 mg/dL (ref 0.44–1.00)
GFR, Estimated: >60 mL/min (ref >60)
Glucose, Bld: 80 mg/dL (ref 70–99)
Potassium: 3.8 mmol/L (ref 3.5–5.1)
Sodium: 140 mmol/L (ref 135–145)
Total Bilirubin: 0.4 mg/dL (ref 0.0–1.2)
Total Protein: 8.2 g/dL — ABNORMAL HIGH (ref 6.5–8.1)

## 2024-10-11 LAB — CBC
HCT: 39.7 % (ref 36.0–46.0)
Hemoglobin: 13.3 g/dL (ref 12.0–15.0)
MCH: 28.7 pg (ref 26.0–34.0)
MCHC: 33.5 g/dL (ref 30.0–36.0)
MCV: 85.7 fL (ref 80.0–100.0)
Platelets: 293 K/uL (ref 150–400)
RBC: 4.63 MIL/uL (ref 3.87–5.11)
RDW: 13.2 % (ref 11.5–15.5)
WBC: 7.9 K/uL (ref 4.0–10.5)
nRBC: 0 % (ref 0.0–0.2)

## 2024-10-11 LAB — URINALYSIS, ROUTINE W REFLEX MICROSCOPIC
Bacteria, UA: NONE SEEN
Bilirubin Urine: NEGATIVE
Glucose, UA: NEGATIVE mg/dL
Ketones, ur: NEGATIVE mg/dL
Nitrite: NEGATIVE
Protein, ur: NEGATIVE mg/dL
Specific Gravity, Urine: 1.017 (ref 1.005–1.030)
pH: 7 (ref 5.0–8.0)

## 2024-10-11 LAB — LIPASE, BLOOD: Lipase: 29 U/L (ref 11–51)

## 2024-10-11 LAB — PREGNANCY, URINE: Preg Test, Ur: NEGATIVE

## 2024-10-11 MED ORDER — ALUM & MAG HYDROXIDE-SIMETH 200-200-20 MG/5ML PO SUSP
30.0000 mL | Freq: Once | ORAL | Status: AC
  Administered 2024-10-11: 30 mL via ORAL
  Filled 2024-10-11: qty 30

## 2024-10-11 MED ORDER — LIDOCAINE VISCOUS HCL 2 % MT SOLN
15.0000 mL | Freq: Once | OROMUCOSAL | Status: AC
  Administered 2024-10-11: 15 mL via OROMUCOSAL
  Filled 2024-10-11: qty 15

## 2024-10-11 NOTE — ED Provider Notes (Signed)
 [place] EMERGENCY DEPARTMENT AT [facility] Provider Note   CSN: [id number] Arrival date & time: 10/11/24  1714     Patient presents with: Abdominal Pain   Tammy French is a 28 y.o. female.  {Add pertinent medical, surgical, social history, OB history to YEP:67052} Patient with history of asthma, GERD, PCOS here with right upper quadrant abdominal pain.  Symptoms ongoing for the past 3 weeks.  Patient states pain is constant lasting for several hours at a time.  Has been taking pantoprazole  without relief.  Has constant right upper quadrant pain.  Comes in today because pain radiates to the back for the first time.  She was told she needs an ultrasound for her gallbladder but denies any history of gallstones.  Nausea but no vomiting.  No fever.  No chest pain or shortness of breath.  No cough or fever.  She denies any black or bloody stools.  No pain with urination or blood in the urine.  No chest pain or shortness of breath.  She has been taking ibuprofen without relief.  Has had colonoscopy in the past but no EGD.  Was referred to outpatient EGD.  States she was told she could have gallbladder issues but has not had an ultrasound.  Comes in today because of today is the first day the pain is going to her back.  No chest pain or shortness of breath.  No nausea or vomiting.  No fever.  The history is provided by the patient.  Abdominal Pain Associated symptoms: nausea   Associated symptoms: no chest pain, no cough, no dysuria, no fatigue, no fever, no hematuria, no shortness of breath and no vomiting        Prior to Admission medications   Medication Sig Start Date End Date Taking? Authorizing Provider  albuterol  (PROVENTIL  HFA;VENTOLIN  HFA) 108 (90 Base) MCG/ACT inhaler Inhale 2 puffs into the lungs every 6 (six) hours as needed for wheezing or shortness of breath.    [provider]  albuterol  (PROVENTIL ) (2.5 MG/3ML) 0.083% nebulizer solution Take 3 mLs (2.5 mg  total) by nebulization every 6 (six) hours as needed for wheezing or shortness of breath. 01/24/17   Jamelle Lorrayne HERO, NP  albuterol  (VENTOLIN  HFA) 108 (90 Base) MCG/ACT inhaler Inhale 1 puff into the lungs every 4 (four) hours. 11/25/23     amoxicillin -clavulanate (AUGMENTIN ) 875-125 MG tablet Take 1 tablet by mouth every 12 (twelve) hours FOR 10 DAYS. 03/31/24     Calcium Polycarbophil (FIBER-CAPS PO) Take 1 capsule by mouth daily.    [provider]  Choriogonadotropin Alfa  (OVIDREL ) 250 MCG/0.5ML SOSY Inject 1 prefilled syringe as directed by provider 01/21/24     clobetasol  cream (TEMOVATE ) 0.05 % Apply 1 Application topically 2 (two) times daily for 10 days. 11/25/23     docusate sodium  (COLACE) 100 MG capsule Take 1 capsule by mouth daily. 09/14/16   [provider]  Fexofenadine-Pseudoephedrine (ALLEGRA-D PO) Take by mouth.    [provider]  fluticasone  (FLONASE ) 50 MCG/ACT nasal spray Place into both nostrils daily.    [provider]  fluticasone  (FLOVENT  HFA) 44 MCG/ACT inhaler Inhale 2 puffs into the lungs 2 (two) times daily. 11/25/23   Pahwani, Rinka R, MD  ipratropium (ATROVENT ) 0.03 % nasal spray Place 2 sprays into the nose 2 (two) times daily. 03/31/24     ipratropium (ATROVENT ) 0.06 % nasal spray Place 2 sprays into both nostrils 4 (four) times daily. 10/02/19   Anitra Longs  O, PA-C  ipratropium-albuterol  (DUONEB) 0.5-2.5 (3) MG/3ML SOLN Inhale 3 mLs into the lungs 3 (three) times daily as needed for asthma flare 11/25/23     lactulose , encephalopathy, (GENERLAC ) 10 GM/15ML SOLN Take 15ml twice daily 05/25/18   Teressa Toribio SQUIBB, MD  letrozole  (FEMARA ) 2.5 MG tablet Take 3 tablets by mouth on days 3 through 7 on cycle days 01/21/24     levocetirizine (XYZAL ) 5 MG tablet Take 1 tablet (5 mg total) by mouth every evening. 10/02/19   Anitra Rocky KIDD, PA-C  levocetirizine (XYZAL ) 5 MG tablet Take 1 tablet (5 mg total) by mouth daily. 11/25/23     lubiprostone   (AMITIZA ) 8 MCG capsule Take 1 capsule (8 mcg total) by mouth 2 (two) times daily with a meal. 05/05/18   Teressa Toribio SQUIBB, MD  meclizine  (ANTIVERT ) 25 MG tablet Take 1-2 tablets (25-50 mg total) by mouth 3 (three) times daily as needed for vertigo or nausea 11/25/23     medroxyPROGESTERone  (PROVERA ) 10 MG tablet Take 1 tablet (10 mg total) by mouth daily for 5 days. 01/21/24     medroxyPROGESTERone  (PROVERA ) 10 MG tablet Take 1 tablet (10 mg total) by mouth daily. 08/12/24     metFORMIN  (GLUCOPHAGE ) 500 MG tablet Take 0.5 tablets (250 mg total) by mouth daily for 7 days, THEN 0.5 tablets (250 mg total) 2 (two) times daily for 7 days, THEN 1 tablet (500 mg total) every morning and 0.5 tablets (250 mg total) at bedtime for 7 days, THEN 1 tablet (500 mg total) 2 (two) times daily. 01/11/24 02/28/24    metFORMIN  (GLUCOPHAGE -XR) 750 MG 24 hr tablet Take 750 mg by mouth at bedtime. 02/09/18   [provider]  methocarbamol  (ROBAXIN ) 500 MG tablet Take 1 tablet (500 mg total) by mouth 2 (two) times daily. 08/03/18   Delwyn Damien PARAS, PA-C  metoCLOPramide  (REGLAN ) 10 MG tablet Take 1 tablet (10 mg total) by mouth every 6 (six) hours. 02/26/20   Joy, Shawn C, PA-C  misoprostol  (CYTOTEC ) 200 MCG tablet Place 4 tablets (800 mcg total) vaginally once for 1 dose. May repeat in 48 hours if no bleeding occurs. 05/27/24 05/28/24    montelukast  (SINGULAIR ) 10 MG tablet Take 10 mg by mouth daily. 09/19/19   [provider]  montelukast  (SINGULAIR ) 10 MG tablet Take 1 tablet (10 mg total) by mouth daily. 11/25/23     Multiple Vitamin (MULTIVITAMIN) tablet Take 1 tablet by mouth daily.    [provider]  pantoprazole  (PROTONIX ) 40 MG tablet Take 1 tablet (40 mg total) by mouth daily. 11/25/23     Plecanatide  (TRULANCE ) 3 MG TABS Take 1 tablet by mouth daily. 05/14/18   Charlanne Groom, MD  predniSONE  (STERAPRED UNI-PAK 21 TAB) 10 MG (21) TBPK tablet Take 6 tabs (60mg ) day 1, 5 tabs (50mg ) day 2, 4 tabs  (40mg ) day 3, 3 tabs (30mg ) day 4, 2 tabs (20mg ) day 5, and 1 tab (10mg ) day 6. 02/26/20   Joy, Shawn C, PA-C  spironolactone (ALDACTONE) 50 MG tablet Take 50 mg by mouth at bedtime. 02/09/18   [provider]  sucralfate (CARAFATE) 1 g tablet Take 1 tablet (1 g total) by mouth 4 (four) times daily -  with meals and at bedtime. 10/08/24   Arvis Jolan NOVAK, PA-C  traMADol  (ULTRAM ) 50 MG tablet Take 1 tablet (50 mg total) by mouth every 6 (six) hours as needed. 05/27/24     Ubrogepant  (UBRELVY ) 100 MG TABS Take 1 tablet  may take second dose at least 2 hours after first dose as needed. 11/25/23       Allergies: Sumatriptan    Review of Systems  Constitutional:  Negative for activity change, appetite change, fatigue and fever.  HENT:  Negative for congestion and rhinorrhea.   Respiratory:  Negative for cough, chest tightness and shortness of breath.   Cardiovascular:  Negative for chest pain.  Gastrointestinal:  Positive for abdominal pain and nausea. Negative for vomiting.  Genitourinary:  Negative for dysuria and hematuria.  Musculoskeletal:  Negative for arthralgias and myalgias.  Skin:  Negative for rash.  Neurological:  Negative for dizziness, weakness and headaches.   all other systems are negative except as noted in the HPI and PMH.    Updated Vital Signs BP (!) 138/96   Pulse 80   Temp 98.4 F (36.9 C) (Oral)   Resp 15   LMP 09/22/2024 (Exact Date)   SpO2 100%   Physical Exam Vitals and nursing note reviewed.  Constitutional:      General: She is not in acute distress.    Appearance: She is well-developed.  HENT:     Head: Normocephalic and atraumatic.     Mouth/Throat:     Pharynx: No oropharyngeal exudate.  Eyes:     Conjunctiva/sclera: Conjunctivae normal.     Pupils: Pupils are equal, round, and reactive to light.  Neck:     Comments: No meningismus. Cardiovascular:     Rate and Rhythm: Normal rate and regular rhythm.     Heart sounds: Normal heart sounds.  No murmur heard. Pulmonary:     Effort: Pulmonary effort is normal. No respiratory distress.     Breath sounds: Normal breath sounds.  Abdominal:     Palpations: Abdomen is soft.     Tenderness: There is abdominal tenderness. There is guarding. There is no rebound.     Comments: Tender right upper quadrant, epigastrium, and guarding but no rebound.  Musculoskeletal:        General: No tenderness. Normal range of motion.     Cervical back: Normal range of motion and neck supple.  Skin:    General: Skin is warm.  Neurological:     Mental Status: She is alert and oriented to person, place, and time.     Cranial Nerves: No cranial nerve deficit.     Motor: No abnormal muscle tone.     Coordination: Coordination normal.     Comments:  5/5 strength throughout. CN 2-12 intact.Equal grip strength.   Psychiatric:        Behavior: Behavior normal.     (all labs ordered are listed, but only abnormal results are displayed) Labs Reviewed  COMPREHENSIVE METABOLIC PANEL WITH GFR - Abnormal; Notable for the following components:      Result Value   Total Protein 8.2 (*)    Albumin 5.2 (*)    All other components within normal limits  URINALYSIS, ROUTINE W REFLEX MICROSCOPIC - Abnormal; Notable for the following components:   Hgb urine dipstick TRACE (*)    Leukocytes,Ua MODERATE (*)    All other components within normal limits  LIPASE, BLOOD  CBC  PREGNANCY, URINE  D-DIMER, QUANTITATIVE  TROPONIN T, HIGH SENSITIVITY    EKG: None  Radiology: No results found.  {Document cardiac monitor, telemetry assessment procedure when appropriate:32947} Procedures   Medications Ordered in the ED  alum & mag hydroxide-simeth (MAALOX/MYLANTA) 200-200-20 MG/5ML suspension 30 mL (has no administration in time range)  lidocaine  (XYLOCAINE )  2 % viscous mouth solution 15 mL (has no administration in time range)      {Click here for ABCD2, HEART and other calculators REFRESH Note before signing:1}                               Medical Decision Making Amount and/or Complexity of Data Reviewed Labs: ordered. Decision-making details documented in ED Course. Radiology: ordered and independent interpretation performed. Decision-making details documented in ED Course. ECG/medicine tests: ordered and independent interpretation performed. Decision-making details documented in ED Course.  Risk OTC drugs. Prescription drug management.   Several weeks of right upper quadrant pain.  Nausea but no vomiting.  Comes in today because the pain is going to her back for the first time.  No black or bloody stools.  No vomiting.  Abdomen soft without peritoneal signs.  Stable vital signs.  Concern for colelithiasis versus biliary colic.  Will check labs.  {Document critical care time when appropriate  Document review of labs and clinical decision tools ie CHADS2VASC2, etc  Document your independent review of radiology images and any outside records  Document your discussion with family members, caretakers and with consultants  Document social determinants of health affecting pt's care  Document your decision making why or why not admission, treatments were needed:32947:::1}   Final diagnoses:  None    ED Discharge Orders     None

## 2024-10-11 NOTE — ED Triage Notes (Signed)
 Upper right abd pain. 2 weeks. Labs on 17th 'normal' with UC. Followed up with another UC and sent here for abd work up. Last BM yesterday. Denies urinary symptoms. -N/-V/-D. Denies EtOH use.

## 2024-10-12 ENCOUNTER — Emergency Department (HOSPITAL_BASED_OUTPATIENT_CLINIC_OR_DEPARTMENT_OTHER)

## 2024-10-12 DIAGNOSIS — R7989 Other specified abnormal findings of blood chemistry: Secondary | ICD-10-CM | POA: Diagnosis not present

## 2024-10-12 DIAGNOSIS — K449 Diaphragmatic hernia without obstruction or gangrene: Secondary | ICD-10-CM | POA: Diagnosis not present

## 2024-10-12 DIAGNOSIS — R1011 Right upper quadrant pain: Secondary | ICD-10-CM | POA: Diagnosis not present

## 2024-10-12 LAB — D-DIMER, QUANTITATIVE: D-Dimer, Quant: 0.52 ug{FEU}/mL — ABNORMAL HIGH (ref 0.00–0.50)

## 2024-10-12 LAB — TROPONIN T, HIGH SENSITIVITY
Troponin T High Sensitivity: <15 ng/L (ref 0–19)
Troponin T High Sensitivity: <15 ng/L (ref 0–19)

## 2024-10-12 MED ORDER — IOHEXOL 300 MG/ML  SOLN
100.0000 mL | Freq: Once | INTRAMUSCULAR | Status: AC | PRN
  Administered 2024-10-12: 100 mL via INTRAVENOUS

## 2024-10-12 MED ORDER — IOHEXOL 350 MG/ML SOLN
75.0000 mL | Freq: Once | INTRAVENOUS | Status: AC | PRN
  Administered 2024-10-12: 75 mL via INTRAVENOUS

## 2024-10-12 MED ORDER — SUCRALFATE 1 G PO TABS: 1.0000 g | ORAL_TABLET | Freq: Three times a day (TID) | ORAL | 0 refills | Status: AC

## 2024-10-12 NOTE — ED Notes (Signed)
 Patient has had some water with no signs of nausea or emesis.

## 2024-10-12 NOTE — ED Notes (Signed)
 RN provided AVS using Teachback Method. Patient verbalizes understanding of Discharge Instructions. Opportunity for Questioning and Answers were provided by RN. Patient Discharged from ED ambulatory to home with family friend.

## 2024-10-12 NOTE — Discharge Instructions (Signed)
 No signs of gallstones.  Continue your pantoprazole .  Avoid alcohol, caffeine, NSAID medications, spicy foods.  Follow-up with the gastroenterologist for consideration of EGD to assess for acid reflux or ulcers.  Return to the ED for worsening pain, vomiting, black or bloody stools or other concerns.

## 2024-10-12 NOTE — ED Notes (Signed)
 Patient transported to radiology

## 2024-10-17 ENCOUNTER — Encounter: Payer: Self-pay | Admitting: Gastroenterology

## 2024-10-18 ENCOUNTER — Other Ambulatory Visit: Payer: Self-pay

## 2024-10-18 MED ORDER — ONDANSETRON HCL 4 MG PO TABS
4.0000 mg | ORAL_TABLET | Freq: Four times a day (QID) | ORAL | 0 refills | Status: AC | PRN
  Filled 2024-10-18: qty 15, 4d supply, fill #0

## 2024-10-19 ENCOUNTER — Other Ambulatory Visit

## 2024-10-26 ENCOUNTER — Other Ambulatory Visit: Payer: Self-pay

## 2024-10-26 DIAGNOSIS — N941 Unspecified dyspareunia: Secondary | ICD-10-CM | POA: Diagnosis not present

## 2024-10-26 DIAGNOSIS — G43009 Migraine without aura, not intractable, without status migrainosus: Secondary | ICD-10-CM | POA: Diagnosis not present

## 2024-10-26 DIAGNOSIS — J45909 Unspecified asthma, uncomplicated: Secondary | ICD-10-CM | POA: Diagnosis not present

## 2024-10-26 DIAGNOSIS — J309 Allergic rhinitis, unspecified: Secondary | ICD-10-CM | POA: Diagnosis not present

## 2024-10-26 DIAGNOSIS — E282 Polycystic ovarian syndrome: Secondary | ICD-10-CM | POA: Diagnosis not present

## 2024-10-26 DIAGNOSIS — R7303 Prediabetes: Secondary | ICD-10-CM | POA: Diagnosis not present

## 2024-10-26 DIAGNOSIS — E7800 Pure hypercholesterolemia, unspecified: Secondary | ICD-10-CM | POA: Diagnosis not present

## 2024-10-26 DIAGNOSIS — K219 Gastro-esophageal reflux disease without esophagitis: Secondary | ICD-10-CM | POA: Diagnosis not present

## 2024-10-26 DIAGNOSIS — Z0000 Encounter for general adult medical examination without abnormal findings: Secondary | ICD-10-CM | POA: Diagnosis not present

## 2024-10-26 DIAGNOSIS — L2089 Other atopic dermatitis: Secondary | ICD-10-CM | POA: Diagnosis not present

## 2024-10-26 MED ORDER — ALBUTEROL SULFATE HFA 108 (90 BASE) MCG/ACT IN AERS
1.0000 | INHALATION_SPRAY | RESPIRATORY_TRACT | 3 refills | Status: AC | PRN
  Filled 2024-10-26: qty 8.5, 34d supply, fill #0

## 2024-10-26 MED ORDER — FLUTICASONE PROPIONATE HFA 44 MCG/ACT IN AERO
2.0000 | INHALATION_SPRAY | Freq: Two times a day (BID) | RESPIRATORY_TRACT | 1 refills | Status: AC
  Filled 2024-10-26: qty 10.6, 30d supply, fill #0

## 2024-11-04 DIAGNOSIS — R1013 Epigastric pain: Secondary | ICD-10-CM | POA: Diagnosis not present

## 2024-11-04 DIAGNOSIS — K219 Gastro-esophageal reflux disease without esophagitis: Secondary | ICD-10-CM | POA: Diagnosis not present

## 2024-11-04 DIAGNOSIS — R140 Abdominal distension (gaseous): Secondary | ICD-10-CM | POA: Diagnosis not present

## 2024-11-09 DIAGNOSIS — R109 Unspecified abdominal pain: Secondary | ICD-10-CM | POA: Diagnosis not present

## 2024-11-28 ENCOUNTER — Other Ambulatory Visit: Payer: Self-pay

## 2024-11-28 MED ORDER — HYDROXYZINE HCL 25 MG PO TABS
25.0000 mg | ORAL_TABLET | Freq: Every day | ORAL | 0 refills | Status: AC | PRN
  Filled 2024-11-28: qty 30, 30d supply, fill #0

## 2024-12-01 ENCOUNTER — Other Ambulatory Visit: Payer: Self-pay

## 2024-12-02 ENCOUNTER — Ambulatory Visit: Admitting: Gastroenterology

## 2024-12-14 ENCOUNTER — Other Ambulatory Visit: Payer: Self-pay

## 2024-12-14 MED ORDER — OMEPRAZOLE 40 MG PO CPDR
40.0000 mg | DELAYED_RELEASE_CAPSULE | Freq: Every morning | ORAL | 3 refills | Status: AC
  Filled 2024-12-14: qty 90, 90d supply, fill #0
